# Patient Record
Sex: Female | Born: 1965
Health system: Southern US, Community
[De-identification: ages and names within clinical notes are randomized; demographics above are authoritative.]

## PROBLEM LIST (undated history)

## (undated) ENCOUNTER — Ambulatory Visit

## (undated) ENCOUNTER — Encounter: Attending: Family | Primary: Family

## (undated) ENCOUNTER — Encounter

## (undated) ENCOUNTER — Encounter: Attending: Physician Assistant | Primary: Physician Assistant

## (undated) ENCOUNTER — Ambulatory Visit: Payer: PRIVATE HEALTH INSURANCE

## (undated) ENCOUNTER — Encounter
Attending: Student in an Organized Health Care Education/Training Program | Primary: Student in an Organized Health Care Education/Training Program

## (undated) ENCOUNTER — Telehealth: Attending: Physician Assistant | Primary: Physician Assistant

## (undated) ENCOUNTER — Ambulatory Visit
Payer: PRIVATE HEALTH INSURANCE | Attending: Student in an Organized Health Care Education/Training Program | Primary: Student in an Organized Health Care Education/Training Program

## (undated) ENCOUNTER — Ambulatory Visit
Payer: BLUE CROSS/BLUE SHIELD | Attending: Student in an Organized Health Care Education/Training Program | Primary: Student in an Organized Health Care Education/Training Program

## (undated) ENCOUNTER — Encounter: Attending: Family Medicine | Primary: Family Medicine

## (undated) ENCOUNTER — Telehealth

## (undated) ENCOUNTER — Telehealth
Attending: Student in an Organized Health Care Education/Training Program | Primary: Student in an Organized Health Care Education/Training Program

## (undated) ENCOUNTER — Encounter: Payer: BLUE CROSS/BLUE SHIELD | Attending: Physician Assistant | Primary: Physician Assistant

## (undated) ENCOUNTER — Ambulatory Visit: Payer: BLUE CROSS/BLUE SHIELD

## (undated) ENCOUNTER — Encounter: Payer: BLUE CROSS/BLUE SHIELD | Attending: Family | Primary: Family

## (undated) ENCOUNTER — Encounter: Attending: Diagnostic Radiology | Primary: Diagnostic Radiology

---

## 1999-01-19 HISTORY — PX: TUBAL LIGATION: SHX77

## 2004-06-01 ENCOUNTER — Ambulatory Visit: Payer: Self-pay | Admitting: Obstetrics and Gynecology

## 2004-10-07 ENCOUNTER — Ambulatory Visit: Payer: Self-pay | Admitting: Obstetrics and Gynecology

## 2004-10-20 ENCOUNTER — Ambulatory Visit: Payer: Self-pay | Admitting: General Surgery

## 2005-04-12 ENCOUNTER — Ambulatory Visit: Payer: Self-pay | Admitting: General Surgery

## 2005-07-26 ENCOUNTER — Ambulatory Visit: Payer: Self-pay | Admitting: Physician Assistant

## 2006-01-20 ENCOUNTER — Ambulatory Visit: Payer: Self-pay | Admitting: Obstetrics and Gynecology

## 2009-04-23 ENCOUNTER — Ambulatory Visit: Payer: Self-pay | Admitting: Obstetrics and Gynecology

## 2009-04-25 ENCOUNTER — Ambulatory Visit: Payer: Self-pay | Admitting: Obstetrics and Gynecology

## 2010-12-26 ENCOUNTER — Ambulatory Visit: Payer: Self-pay | Admitting: Specialist

## 2011-01-19 HISTORY — PX: ABLATION: SHX5711

## 2011-10-18 ENCOUNTER — Ambulatory Visit: Payer: Self-pay | Admitting: Family Medicine

## 2011-11-22 ENCOUNTER — Ambulatory Visit: Payer: Self-pay | Admitting: Gastroenterology

## 2012-03-08 ENCOUNTER — Ambulatory Visit: Payer: Self-pay | Admitting: Obstetrics and Gynecology

## 2012-05-15 ENCOUNTER — Ambulatory Visit: Payer: Self-pay | Admitting: Gastroenterology

## 2012-09-30 ENCOUNTER — Emergency Department: Payer: Self-pay | Admitting: Emergency Medicine

## 2012-09-30 LAB — URINALYSIS, COMPLETE
Bilirubin,UR: NEGATIVE
Glucose,UR: NEGATIVE mg/dL (ref 0–75)
Ketone: NEGATIVE
Leukocyte Esterase: NEGATIVE
Ph: 5 (ref 4.5–8.0)
RBC,UR: 1 /HPF (ref 0–5)
Squamous Epithelial: 13
WBC UR: 1 /HPF (ref 0–5)

## 2012-09-30 LAB — COMPREHENSIVE METABOLIC PANEL
Albumin: 3.7 g/dL (ref 3.4–5.0)
Alkaline Phosphatase: 86 U/L (ref 50–136)
Anion Gap: 5 — ABNORMAL LOW (ref 7–16)
BUN: 12 mg/dL (ref 7–18)
Bilirubin,Total: 0.8 mg/dL (ref 0.2–1.0)
Calcium, Total: 8.8 mg/dL (ref 8.5–10.1)
Chloride: 107 mmol/L (ref 98–107)
Co2: 26 mmol/L (ref 21–32)
Creatinine: 0.91 mg/dL (ref 0.60–1.30)
Osmolality: 275 (ref 275–301)
Potassium: 3.9 mmol/L (ref 3.5–5.1)
SGOT(AST): 26 U/L (ref 15–37)
SGPT (ALT): 50 U/L (ref 12–78)
Sodium: 138 mmol/L (ref 136–145)
Total Protein: 7.1 g/dL (ref 6.4–8.2)

## 2012-09-30 LAB — CBC
HCT: 38.2 % (ref 35.0–47.0)
MCHC: 34.4 g/dL (ref 32.0–36.0)
RDW: 13.3 % (ref 11.5–14.5)

## 2012-09-30 LAB — TROPONIN I: Troponin-I: <0.02 ng/mL

## 2013-01-18 HISTORY — PX: BREAST CYST ASPIRATION: SHX578

## 2013-03-09 ENCOUNTER — Ambulatory Visit: Payer: Self-pay | Admitting: Obstetrics and Gynecology

## 2013-03-12 ENCOUNTER — Ambulatory Visit: Payer: Self-pay | Admitting: Obstetrics and Gynecology

## 2013-05-29 DIAGNOSIS — N949 Unspecified condition associated with female genital organs and menstrual cycle: Secondary | ICD-10-CM | POA: Insufficient documentation

## 2013-05-29 DIAGNOSIS — N83209 Unspecified ovarian cyst, unspecified side: Secondary | ICD-10-CM | POA: Insufficient documentation

## 2014-10-30 ENCOUNTER — Other Ambulatory Visit: Payer: Self-pay | Admitting: Obstetrics and Gynecology

## 2014-10-30 DIAGNOSIS — Z1231 Encounter for screening mammogram for malignant neoplasm of breast: Secondary | ICD-10-CM

## 2015-10-23 DIAGNOSIS — R002 Palpitations: Secondary | ICD-10-CM | POA: Insufficient documentation

## 2016-11-18 DIAGNOSIS — S96911A Strain of unspecified muscle and tendon at ankle and foot level, right foot, initial encounter: Secondary | ICD-10-CM | POA: Diagnosis not present

## 2016-11-18 DIAGNOSIS — M79671 Pain in right foot: Secondary | ICD-10-CM | POA: Diagnosis not present

## 2016-11-22 DIAGNOSIS — Z0000 Encounter for general adult medical examination without abnormal findings: Secondary | ICD-10-CM | POA: Diagnosis not present

## 2016-11-22 DIAGNOSIS — E559 Vitamin D deficiency, unspecified: Secondary | ICD-10-CM | POA: Diagnosis not present

## 2016-11-22 DIAGNOSIS — Z7189 Other specified counseling: Secondary | ICD-10-CM | POA: Insufficient documentation

## 2016-11-22 DIAGNOSIS — Z7185 Encounter for immunization safety counseling: Secondary | ICD-10-CM | POA: Insufficient documentation

## 2016-11-22 DIAGNOSIS — F411 Generalized anxiety disorder: Secondary | ICD-10-CM | POA: Insufficient documentation

## 2016-11-26 DIAGNOSIS — E559 Vitamin D deficiency, unspecified: Secondary | ICD-10-CM | POA: Diagnosis not present

## 2016-11-26 DIAGNOSIS — Z0000 Encounter for general adult medical examination without abnormal findings: Secondary | ICD-10-CM | POA: Diagnosis not present

## 2016-11-29 DIAGNOSIS — E559 Vitamin D deficiency, unspecified: Secondary | ICD-10-CM | POA: Insufficient documentation

## 2016-11-29 DIAGNOSIS — R7303 Prediabetes: Secondary | ICD-10-CM | POA: Insufficient documentation

## 2016-11-30 DIAGNOSIS — N390 Urinary tract infection, site not specified: Secondary | ICD-10-CM | POA: Diagnosis not present

## 2016-11-30 DIAGNOSIS — R399 Unspecified symptoms and signs involving the genitourinary system: Secondary | ICD-10-CM | POA: Diagnosis not present

## 2017-01-04 DIAGNOSIS — E7800 Pure hypercholesterolemia, unspecified: Secondary | ICD-10-CM | POA: Diagnosis not present

## 2017-01-04 DIAGNOSIS — M5440 Lumbago with sciatica, unspecified side: Secondary | ICD-10-CM | POA: Diagnosis not present

## 2017-01-04 DIAGNOSIS — R350 Frequency of micturition: Secondary | ICD-10-CM | POA: Diagnosis not present

## 2017-02-01 DIAGNOSIS — M21621 Bunionette of right foot: Secondary | ICD-10-CM | POA: Diagnosis not present

## 2017-03-01 DIAGNOSIS — R7303 Prediabetes: Secondary | ICD-10-CM | POA: Diagnosis not present

## 2017-03-01 DIAGNOSIS — E7800 Pure hypercholesterolemia, unspecified: Secondary | ICD-10-CM | POA: Diagnosis not present

## 2017-03-03 DIAGNOSIS — R350 Frequency of micturition: Secondary | ICD-10-CM | POA: Diagnosis not present

## 2017-03-03 DIAGNOSIS — R7303 Prediabetes: Secondary | ICD-10-CM | POA: Diagnosis not present

## 2017-03-16 ENCOUNTER — Ambulatory Visit: Payer: 59 | Admitting: Urology

## 2017-03-16 ENCOUNTER — Encounter: Payer: Self-pay | Admitting: Urology

## 2017-03-16 VITALS — BP 148/83 | HR 87 | Resp 16 | Ht 64.0 in | Wt 178.0 lb

## 2017-03-16 DIAGNOSIS — R358 Other polyuria: Secondary | ICD-10-CM | POA: Diagnosis not present

## 2017-03-16 DIAGNOSIS — R3589 Other polyuria: Secondary | ICD-10-CM

## 2017-03-16 LAB — BLADDER SCAN AMB NON-IMAGING: Scan Result: 46

## 2017-03-16 NOTE — Progress Notes (Signed)
03/16/2017 10:54 AM   Rachel [name] 11-03-1965 [id number]  Referring provider: Bobby Rumpfarew, Khary, MD [address] [place], [place] [id number]  Chief Complaint  Patient presents with  . New Patient (Initial Visit)    bladder issue    HPI: Patient is a 52 -year-old [place] American female who is referred to us by, Dr. Burnadette PopLinthavong, for urinary frequency.    Patient states that she has had urinary frequency and suprapubic pressure since November 2018.  She was seen by urgent care on three occassions and was found to have negative UA's and negative urine cultures.  She has baseline nocturia x 2.  Her PVR is 46 mL.    She is not having associated urinary urgency, dysuria, intermittency, hesitancy, straining to urinate and weak urinary stream.   She does/does not have a history of urinary tract infections, STI's or injury to the bladder.     She currently denies dysuria, gross hematuria, suprapubic pain, back pain, abdominal pain or flank pain.  She has not had any recent fevers, chills, nausea or vomiting.   She does not have a history of nephrolithiasis, GU surgery or GU trauma.   She denies constipation and/or diarrhea.   She has not had any recent imaging studies.    In December, she decided to eliminate all colas from her diet.  She is now only drinking water and her symptoms have abated entirely.        PMH: History reviewed. No pertinent past medical history.  Surgical History: Past Surgical History:  Procedure Laterality Date  . ABLATION  2013  . TUBAL LIGATION  2001    Home Medications:  Allergies as of 03/16/2017      Reactions   Sulfa Antibiotics Diarrhea   Dexlansoprazole Diarrhea, Nausea Only      Medication List        Accurate as of 03/16/17 10:54 AM. Always use your most recent med list.          metoprolol tartrate 25 MG tablet Commonly known as:  LOPRESSOR Take by mouth.   PARoxetine 20 MG tablet Commonly known as:  PAXIL Take by mouth.       Allergies:  Allergies  Allergen Reactions  . Sulfa Antibiotics Diarrhea  . Dexlansoprazole Diarrhea and Nausea Only    Family History: History reviewed. No pertinent family history.  Social History:  reports that  has never smoked. she has never used smokeless tobacco. She reports that she does not drink alcohol or use drugs.  ROS: UROLOGY Frequent Urination?: Yes Hard to postpone urination?: No Burning/pain with urination?: No Get up at night to urinate?: Yes Leakage of urine?: No Urine stream starts and stops?: No Trouble starting stream?: No Do you have to strain to urinate?: No Blood in urine?: No Urinary tract infection?: No Sexually transmitted disease?: No Injury to kidneys or bladder?: No Painful intercourse?: No Weak stream?: No Currently pregnant?: No Vaginal bleeding?: No Last menstrual period?: n  Gastrointestinal Nausea?: No Vomiting?: No Indigestion/heartburn?: No Diarrhea?: No Constipation?: No  Constitutional Fever: No Night sweats?: Yes Weight loss?: No Fatigue?: No  Skin Skin rash/lesions?: No Itching?: No  Eyes Blurred vision?: No Double vision?: No  Ears/Nose/Throat Sore throat?: No Sinus problems?: No  Hematologic/Lymphatic Swollen glands?: No Easy bruising?: No  Cardiovascular Leg swelling?: No Chest pain?: No  Respiratory Cough?: No Shortness of breath?: No  Endocrine Excessive thirst?: No  Musculoskeletal Back pain?: No Joint pain?: No  Neurological Headaches?: No Dizziness?:  No  Psychologic Depression?: No Anxiety?: No  Physical Exam: BP (!) 148/83   Pulse 87   Resp 16   Ht 5\' 4"  (1.626 m)   Wt 178 lb (80.7 kg)   SpO2 98%   BMI 30.55 kg/m   Constitutional: Well nourished. Alert and oriented, No acute distress. HEENT: [place] AT, moist mucus membranes. Trachea midline, no masses. Cardiovascular: No clubbing, cyanosis, or edema. Respiratory: Normal respiratory effort, no increased work of  breathing. GI: Abdomen is soft, non tender, non distended, no abdominal masses. Liver and spleen not palpable.  No hernias appreciated.  Stool sample for occult testing is not indicated.   GU: No CVA tenderness.  No bladder fullness or masses.   Skin: No rashes, bruises or suspicious lesions. Lymph: No cervical or inguinal adenopathy. Neurologic: Grossly intact, no focal deficits, moving all 4 extremities. Psychiatric: Normal mood and affect.  Laboratory Data: Lab Results  Component Value Date   WBC 5.3 09/30/2012   HGB 13.2 09/30/2012   HCT 38.2 09/30/2012   MCV 85 09/30/2012   PLT 268 09/30/2012    Lab Results  Component Value Date   CREATININE 0.91 09/30/2012    No results found for: PSA  No results found for: TESTOSTERONE  No results found for: HGBA1C  No results found for: TSH  No results found for: CHOL, HDL, CHOLHDL, VLDL, LDLCALC  Lab Results  Component Value Date   AST 26 09/30/2012   Lab Results  Component Value Date   ALT 50 09/30/2012   No components found for: ALKALINEPHOPHATASE No components found for: BILIRUBINTOTAL  No results found for: ESTRADIOL  Urinalysis    Component Value Date/Time   COLORURINE Yellow 09/30/2012 0736   APPEARANCEUR Hazy 09/30/2012 0736   LABSPEC 1.025 09/30/2012 0736   PHURINE 5.0 09/30/2012 0736   GLUCOSEU Negative 09/30/2012 0736   HGBUR Negative 09/30/2012 0736   BILIRUBINUR Negative 09/30/2012 0736   KETONESUR Negative 09/30/2012 0736   PROTEINUR Negative 09/30/2012 0736   NITRITE Negative 09/30/2012 0736   LEUKOCYTESUR Negative 09/30/2012 0736    I have reviewed the labs.   Pertinent Imaging: Results for [name], [name] (MRN [id number]) as of 03/18/2017 18:08  Ref. Range 03/16/2017 10:15  Scan Result Unknown 46    Assessment & Plan:    1. Frequency -resolved -Discussed with the patient that she may possibly have interstitial cystitis as she has a typical presentation of urinary tract infection  symptoms with negative cultures but that this was a diagnosis of exclusion and that further testing would be involved if we wanted to investigate her condition further -She stated at this time since her symptoms have abated with the elimination of sodas that she would like to hold on any further testing unless her symptoms return -I have given her the bladder irritant food list - Bladder Scan (Post Void Residual) in office -She will return if her symptoms return or if she should develop gross hematuria or AMH is found by PCP on UA's    Return if symptoms worsen or fail to improve.  These notes generated with voice recognition software. I apologize for typographical errors.  Michiel Cowboy, PA-C  [facility] [address], [address] [place], [place] [address] [phone]   I spent 40 minutes with this patient regarding her urinary condition greater than 50% was spent in counseling & coordination of care with the patient.

## 2017-03-17 DIAGNOSIS — M79671 Pain in right foot: Secondary | ICD-10-CM | POA: Diagnosis not present

## 2017-03-17 DIAGNOSIS — M7671 Peroneal tendinitis, right leg: Secondary | ICD-10-CM | POA: Diagnosis not present

## 2017-04-14 DIAGNOSIS — M7671 Peroneal tendinitis, right leg: Secondary | ICD-10-CM | POA: Diagnosis not present

## 2017-05-06 ENCOUNTER — Other Ambulatory Visit: Payer: Self-pay

## 2017-05-06 ENCOUNTER — Emergency Department
Admission: EM | Admit: 2017-05-06 | Discharge: 2017-05-06 | Disposition: A | Discharge status: Home / Self Care | Payer: 59 | Attending: Student in an Organized Health Care Education/Training Program | Admitting: Student in an Organized Health Care Education/Training Program

## 2017-05-06 ENCOUNTER — Encounter: Payer: Self-pay | Admitting: Emergency Medicine

## 2017-05-06 DIAGNOSIS — S81851A Open bite, right lower leg, initial encounter: Secondary | ICD-10-CM | POA: Insufficient documentation

## 2017-05-06 DIAGNOSIS — Z23 Encounter for immunization: Secondary | ICD-10-CM | POA: Insufficient documentation

## 2017-05-06 DIAGNOSIS — Y9389 Activity, other specified: Secondary | ICD-10-CM | POA: Diagnosis not present

## 2017-05-06 DIAGNOSIS — R2241 Localized swelling, mass and lump, right lower limb: Secondary | ICD-10-CM | POA: Insufficient documentation

## 2017-05-06 DIAGNOSIS — Z79899 Other long term (current) drug therapy: Secondary | ICD-10-CM | POA: Diagnosis not present

## 2017-05-06 DIAGNOSIS — W540XXA Bitten by dog, initial encounter: Secondary | ICD-10-CM | POA: Diagnosis not present

## 2017-05-06 DIAGNOSIS — Y92019 Unspecified place in single-family (private) house as the place of occurrence of the external cause: Secondary | ICD-10-CM | POA: Diagnosis not present

## 2017-05-06 DIAGNOSIS — Y998 Other external cause status: Secondary | ICD-10-CM | POA: Diagnosis not present

## 2017-05-06 MED ORDER — AMOXICILLIN-POT CLAVULANATE 875-125 MG PO TABS: 1.0000 | ORAL_TABLET | Freq: Two times a day (BID) | ORAL | 0 refills | Status: AC

## 2017-05-06 MED ORDER — TETANUS-DIPHTH-ACELL PERTUSSIS 5-2.5-18.5 LF-MCG/0.5 IM SUSP
0.5000 mL | Freq: Once | INTRAMUSCULAR | Status: AC
  Administered 2017-05-06: 0.5 mL via INTRAMUSCULAR
  Filled 2017-05-06: qty 0.5

## 2017-05-06 MED ORDER — AMOXICILLIN-POT CLAVULANATE 875-125 MG PO TABS: 1.0000 | ORAL_TABLET | Freq: Once | ORAL | Status: DC

## 2017-05-06 NOTE — ED Notes (Signed)
Pt states she was bit by a friend's dog in the last hour on her lower right leg. Pt reports stinging pain. States per friend, dog's vaccines are "current within the last 3-5 years." Pt unsure further. Pt in NAD at this time. Ambulatory from triage.

## 2017-05-06 NOTE — ED Triage Notes (Signed)
Dog bite, to right shin. Bitten by sister's dog today.  Incident took place [address] sisters [address] [place], [place].

## 2017-05-06 NOTE — Discharge Instructions (Addendum)
Please apply ice to the right leg 20 minutes every hour as needed for any pain or swelling.  You may alternate Tylenol and/or ibuprofen as needed for pain.  Please take antibiotics as prescribed to prevent any infection to the right lower leg.  If any increasing pain, swelling, redness return to the ER.

## 2017-05-06 NOTE — ED Provider Notes (Signed)
[facility] EMERGENCY DEPARTMENT Provider Note   CSN: [id number] Arrival date & time: 05/06/17  1644     History   Chief Complaint Chief Complaint  Patient presents with  . Animal Bite    HPI CHI WOODHAM is a 52 y.o. female presents to the emergency department for evaluation of animal bite to the right leg.  Injury occurred just prior to arrival.  Patient's tetanus and dog's rabies vaccinations are not noted to be up-to-date.  Patient states she was at a family member's house when she was bit by small Maltese in the right anterior shin.  There was no bleeding.  Patient started to develop mild swelling with mild redness and decided come to the emergency department.  Patient's pain is 0 out of 10.  She is ambulatory no assistive devices.  Animal control has been notified.    HPI  History reviewed. No pertinent past medical history.  There are no active problems to display for this patient.   Past Surgical History:  Procedure Laterality Date  . ABLATION  2013  . TUBAL LIGATION  2001     OB History   None      Home Medications    Prior to Admission medications   Medication Sig Start Date End Date Taking? Authorizing Provider  amoxicillin-clavulanate (AUGMENTIN) 875-125 MG tablet Take 1 tablet by mouth every 12 (twelve) hours for 5 days. 05/06/17 05/11/17  Evon Slack, PA-C  metoprolol tartrate (LOPRESSOR) 25 MG tablet Take by mouth.    [provider]  PARoxetine (PAXIL) 20 MG tablet Take by mouth. 01/04/17 01/04/18  [provider]    Family History No family history on file.  Social History Social History   Tobacco Use  . Smoking status: Never Smoker  . Smokeless tobacco: Never Used  Substance Use Topics  . Alcohol use: No    Frequency: Never  . Drug use: No     Allergies   Sulfa antibiotics and Dexlansoprazole   Review of Systems Review of Systems  Constitutional: Negative for activity change and  fever.  Musculoskeletal: Negative for arthralgias, gait problem, joint swelling and myalgias.  Skin: Positive for rash. Negative for wound.  Neurological: Negative for weakness.     Physical Exam Updated Vital Signs BP (!) 146/59 (BP Location: Left Arm)   Pulse 62   Temp 98.2 F (36.8 C) (Oral)   Resp 16   Ht 5\' 4"  (1.626 m)   Wt 78 kg (172 lb)   SpO2 96%   BMI 29.52 kg/m   Physical Exam  Constitutional: She is oriented to person, place, and time. She appears well-developed and well-nourished.  HENT:  Head: Normocephalic and atraumatic.  Neck: Normal range of motion.  Cardiovascular: Normal rate.  Pulmonary/Chest: Effort normal. No respiratory distress.  Musculoskeletal: Normal range of motion.  Examination of the right lower extremity shows small abrasion to the anterior shin with minimal soft tissue swelling present.  There is soft tissue swelling and abrasion is approximately 2 cm in diameter.  There is no active bleeding, skin breakdown is very minimal.  She is nontender palpation.  Neurological: She is alert and oriented to person, place, and time.  Skin: Skin is warm. No rash noted.  Psychiatric: She has a normal mood and affect. Her behavior is normal. Thought content normal.     ED Treatments / Results  Labs (all labs ordered are listed, but only abnormal results are displayed) Labs Reviewed - No data  to display  EKG None  Radiology No results found.  Procedures Procedures (including critical care time)  Medications Ordered in ED Medications  Tdap (BOOSTRIX) injection 0.5 mL (has no administration in time range)     Initial Impression / Assessment and Plan / ED Course  I have reviewed the triage vital signs and the nursing notes.  Pertinent labs & imaging results that were available during my care of the patient were reviewed by me and considered in my medical decision making (see chart for details).     52 year old female with dog bite to the  right lower leg.  And will control notified.  Patient's tetanus is updated.  She is started on 5-day course of Augmentin for prophylaxis.  Animal control notified and will follow up to make sure dogs vaccinations are either up-to-date Toradol can be quarantined.  Final Clinical Impressions(s) / ED Diagnoses   Final diagnoses:  Dog bite, initial encounter  Dog bite of right lower leg, initial encounter    ED Discharge Orders        Ordered    amoxicillin-clavulanate (AUGMENTIN) 875-125 MG tablet  Every 12 hours     05/06/17 1707       Ronnette JuniperGaines, Lyall Faciane C, PA-C 05/06/17 1717    Willy Eddyobinson, Patrick, MD 05/06/17 [phone]

## 2017-05-09 DIAGNOSIS — E7800 Pure hypercholesterolemia, unspecified: Secondary | ICD-10-CM | POA: Diagnosis not present

## 2017-05-09 DIAGNOSIS — E559 Vitamin D deficiency, unspecified: Secondary | ICD-10-CM | POA: Diagnosis not present

## 2017-05-09 DIAGNOSIS — R7303 Prediabetes: Secondary | ICD-10-CM | POA: Diagnosis not present

## 2017-05-11 DIAGNOSIS — R7303 Prediabetes: Secondary | ICD-10-CM | POA: Diagnosis not present

## 2017-05-11 DIAGNOSIS — R740 Nonspecific elevation of levels of transaminase and lactic acid dehydrogenase [LDH]: Secondary | ICD-10-CM | POA: Diagnosis not present

## 2017-05-11 DIAGNOSIS — E559 Vitamin D deficiency, unspecified: Secondary | ICD-10-CM | POA: Diagnosis not present

## 2017-05-18 DIAGNOSIS — R945 Abnormal results of liver function studies: Secondary | ICD-10-CM | POA: Diagnosis not present

## 2017-06-07 DIAGNOSIS — R079 Chest pain, unspecified: Secondary | ICD-10-CM | POA: Diagnosis not present

## 2017-06-07 DIAGNOSIS — R0789 Other chest pain: Secondary | ICD-10-CM | POA: Diagnosis not present

## 2017-06-07 DIAGNOSIS — R002 Palpitations: Secondary | ICD-10-CM | POA: Diagnosis not present

## 2017-06-16 DIAGNOSIS — R079 Chest pain, unspecified: Secondary | ICD-10-CM | POA: Diagnosis not present

## 2017-06-20 DIAGNOSIS — R002 Palpitations: Secondary | ICD-10-CM | POA: Diagnosis not present

## 2017-06-20 DIAGNOSIS — R079 Chest pain, unspecified: Secondary | ICD-10-CM | POA: Diagnosis not present

## 2017-08-23 DIAGNOSIS — M79671 Pain in right foot: Secondary | ICD-10-CM | POA: Diagnosis not present

## 2017-08-23 DIAGNOSIS — M7741 Metatarsalgia, right foot: Secondary | ICD-10-CM | POA: Diagnosis not present

## 2017-09-14 DIAGNOSIS — M7741 Metatarsalgia, right foot: Secondary | ICD-10-CM | POA: Diagnosis not present

## 2017-09-20 DIAGNOSIS — E559 Vitamin D deficiency, unspecified: Secondary | ICD-10-CM | POA: Diagnosis not present

## 2017-09-20 DIAGNOSIS — R740 Nonspecific elevation of levels of transaminase and lactic acid dehydrogenase [LDH]: Secondary | ICD-10-CM | POA: Diagnosis not present

## 2017-09-20 DIAGNOSIS — R7303 Prediabetes: Secondary | ICD-10-CM | POA: Diagnosis not present

## 2017-09-23 ENCOUNTER — Ambulatory Visit (INDEPENDENT_AMBULATORY_CARE_PROVIDER_SITE_OTHER): Payer: 59

## 2017-09-23 ENCOUNTER — Encounter: Payer: Self-pay | Admitting: Podiatry

## 2017-09-23 ENCOUNTER — Ambulatory Visit: Payer: 59 | Admitting: Podiatry

## 2017-09-23 VITALS — BP 122/77 | HR 81 | Resp 16

## 2017-09-23 DIAGNOSIS — M778 Other enthesopathies, not elsewhere classified: Secondary | ICD-10-CM

## 2017-09-23 DIAGNOSIS — M779 Enthesopathy, unspecified: Secondary | ICD-10-CM | POA: Diagnosis not present

## 2017-09-23 DIAGNOSIS — M7751 Other enthesopathy of right foot: Secondary | ICD-10-CM

## 2017-09-23 MED ORDER — MELOXICAM 15 MG PO TABS: 15.0000 mg | ORAL_TABLET | Freq: Every day | ORAL | 1 refills | Status: AC

## 2017-09-26 NOTE — Progress Notes (Signed)
   HPI: 52 year old female presenting today as a new patient with a chief complaint of aching pain to the right foot that began 4-5 months ago. She reports associated redness of the foot. Walking increases the pain. She has been wearing a CAM boot, has taken Prednisone and Celebrex, had a cortisone injections, all with no significant relief. She also states she had labs drawn that were all negative. Patient is here for further evaluation and treatment.   No past medical history on file.   Physical Exam: General: The patient is alert and oriented x3 in no acute distress.  Dermatology: Skin is warm, dry and supple bilateral lower extremities. Negative for open lesions or macerations.  Vascular: Palpable pedal pulses bilaterally. No edema or erythema noted. Capillary refill within normal limits.  Neurological: Epicritic and protective threshold grossly intact bilaterally.   Musculoskeletal Exam: Pain with palpation of the 5th MPJ of the right foot as well as to the the metatarsals of the right forefoot. Range of motion within normal limits to all pedal and ankle joints bilateral. Muscle strength 5/5 in all groups bilateral.   Radiographic Exam:  Normal osseous mineralization. Joint spaces preserved. No fracture/dislocation/boney destruction.    Assessment: 1. 5th MPJ capsulitis right 2. Metatarsalgia right forefoot   Plan of Care:  1. Patient evaluated. X-Rays reviewed.  2. Injection of 0.5 mLs Celestone Soluspan injected into the 5th MPJ of the right foot.  3. Prescription for Meloxicam provided to patient.  4. Return to clinic in 4 weeks. If not better, we will order an MRI.       Felecia Shelling, DPM [facility]  Dr. Felecia Shelling, DPM    [address] [place], [place] [address]                Office [phone]  Fax [phone]

## 2017-09-27 DIAGNOSIS — E559 Vitamin D deficiency, unspecified: Secondary | ICD-10-CM | POA: Diagnosis not present

## 2017-09-27 DIAGNOSIS — R7303 Prediabetes: Secondary | ICD-10-CM | POA: Diagnosis not present

## 2017-10-14 ENCOUNTER — Other Ambulatory Visit: Payer: Self-pay

## 2017-10-14 DIAGNOSIS — M7751 Other enthesopathy of right foot: Secondary | ICD-10-CM

## 2017-10-21 ENCOUNTER — Encounter: Payer: Self-pay | Admitting: Podiatry

## 2017-10-21 ENCOUNTER — Ambulatory Visit: Payer: 59 | Admitting: Podiatry

## 2017-10-21 DIAGNOSIS — M84374A Stress fracture, right foot, initial encounter for fracture: Secondary | ICD-10-CM | POA: Diagnosis not present

## 2017-10-21 DIAGNOSIS — M7751 Other enthesopathy of right foot: Secondary | ICD-10-CM | POA: Diagnosis not present

## 2017-10-21 NOTE — Progress Notes (Signed)
   HPI: 52 year old female presenting today for follow-up evaluation regarding right forefoot pain.  Patient is been treated for the past 6 months with multiple conservative modalities including immobilization in a Cam boot, oral anti-inflammatory medication (both steroidal and nonsteroidal), cortisone injections, and labs drawn and there is been no satisfactory alleviation of symptoms to the patient.  Patient has been treated by Dr. Ether Griffins, [facility] clinic, prior to presenting to me on 09/23/2017 for second opinion and evaluation.  Today the patient continues to have pain to the right forefoot.  Last visit on 09/23/2017 I injected the fifth MTPJ of the right forefoot and provided meloxicam.  No relief.  She presents for further treatment and evaluation   No past medical history on file.   Physical Exam: General: The patient is alert and oriented x3 in no acute distress.  Dermatology: Skin is warm, dry and supple bilateral lower extremities. Negative for open lesions or macerations.  Vascular: Palpable pedal pulses bilaterally. No edema or erythema noted. Capillary refill within normal limits.  Neurological: Epicritic and protective threshold grossly intact bilaterally.   Musculoskeletal Exam: Pain with palpation of the 5th MPJ of the right foot as well as to the the metatarsals of the right forefoot.  Pain appears to be most significant overlying the fifth metatarsal diaphysis likely consistent with stress fracture.  There is also an audible clicking and popping that is also palpable with range of motion to the fifth MTPJ right foot.  Range of motion within normal limits to all pedal and ankle joints bilateral. Muscle strength 5/5 in all groups bilateral.   Assessment: 1. 5th MPJ capsulitis right 2. Metatarsalgia right forefoot 3.  Possible loose body fifth MTPJ right foot 4.  Likely stress fracture fifth metatarsal right foot   Plan of Care:  1. Patient evaluated.  2.  Today we are going to  order an MRI right forefoot.  Patient is failed multiple conservative modalities.   3.  Return to clinic in 3 weeks to review MRI results     Felecia Shelling, DPM [facility]  Dr. Felecia Shelling, DPM    [address] [place], [place] [address]                Office [phone]  Fax [phone]

## 2017-10-24 ENCOUNTER — Telehealth: Payer: Self-pay

## 2017-10-24 NOTE — Telephone Encounter (Signed)
-----   Message from Felecia Shelling, DPM sent at 10/21/2017  8:26 AM EDT ----- Regarding: MRI RT foot Please order MRI RT foot.   Dx: Stress fracture Rt 5th metatarsal.   Thanks, Dr. Logan Bores

## 2017-10-24 NOTE — Telephone Encounter (Signed)
MRI has been approved from 10/24/17 to 12/08/17   Auth# [id number] Patient has been informed via voice mail to call and schedule appt to her convenience

## 2017-11-03 ENCOUNTER — Ambulatory Visit
Admission: RE | Admit: 2017-11-03 | Discharge: 2017-11-03 | Disposition: A | Discharge status: Home / Self Care | Payer: 59 | Source: Ambulatory Visit | Attending: Podiatry | Admitting: Podiatry

## 2017-11-03 DIAGNOSIS — S99921A Unspecified injury of right foot, initial encounter: Secondary | ICD-10-CM | POA: Diagnosis not present

## 2017-11-03 DIAGNOSIS — M7751 Other enthesopathy of right foot: Secondary | ICD-10-CM | POA: Diagnosis not present

## 2017-11-03 DIAGNOSIS — M79671 Pain in right foot: Secondary | ICD-10-CM | POA: Diagnosis not present

## 2017-11-09 ENCOUNTER — Telehealth: Payer: Self-pay | Admitting: *Deleted

## 2017-11-09 ENCOUNTER — Ambulatory Visit: Payer: 59 | Admitting: Podiatry

## 2017-11-09 DIAGNOSIS — M7751 Other enthesopathy of right foot: Secondary | ICD-10-CM

## 2017-11-09 DIAGNOSIS — M7671 Peroneal tendinitis, right leg: Secondary | ICD-10-CM | POA: Diagnosis not present

## 2017-11-09 NOTE — Telephone Encounter (Signed)
Emailed order to [email or web address].

## 2017-11-09 NOTE — Telephone Encounter (Signed)
-----   Message from Felecia Shelling, DPM sent at 11/09/2017  9:05 AM EDT ----- Regarding: Physical Therapy Please order physical therapy 3x/week x 4 weeks at [facility] in [place].  Thanks, Dr. Logan Bores

## 2017-11-09 NOTE — Telephone Encounter (Signed)
Required form, and demographics faxed to [facility].

## 2017-11-10 ENCOUNTER — Other Ambulatory Visit: Payer: Self-pay | Admitting: Obstetrics and Gynecology

## 2017-11-10 DIAGNOSIS — Z1239 Encounter for other screening for malignant neoplasm of breast: Secondary | ICD-10-CM | POA: Diagnosis not present

## 2017-11-10 DIAGNOSIS — Z124 Encounter for screening for malignant neoplasm of cervix: Secondary | ICD-10-CM | POA: Diagnosis not present

## 2017-11-10 DIAGNOSIS — Z1231 Encounter for screening mammogram for malignant neoplasm of breast: Secondary | ICD-10-CM

## 2017-11-10 DIAGNOSIS — Z1211 Encounter for screening for malignant neoplasm of colon: Secondary | ICD-10-CM | POA: Diagnosis not present

## 2017-11-10 DIAGNOSIS — Z01419 Encounter for gynecological examination (general) (routine) without abnormal findings: Secondary | ICD-10-CM | POA: Diagnosis not present

## 2017-11-13 NOTE — Progress Notes (Signed)
   HPI: 52 year old female presenting today for follow up evaluation of right foot pain. She states the pain is constant and unchanged since her last visit. She notes the pain is greatest in the mornings when she first gets up. She denies modifying factors. Patient is here for further evaluation and treatment.   No past medical history on file.   Physical Exam: General: The patient is alert and oriented x3 in no acute distress.  Dermatology: Skin is warm, dry and supple bilateral lower extremities. Negative for open lesions or macerations.  Vascular: Palpable pedal pulses bilaterally. No edema or erythema noted. Capillary refill within normal limits.  Neurological: Epicritic and protective threshold grossly intact bilaterally.   Musculoskeletal Exam: Pain with palpation to the insertion of the peroneal tendon of the right foot as well as to the 5th MPJ of the right foot. Range of motion within normal limits to all pedal and ankle joints bilateral. Muscle strength 5/5 in all groups bilateral.   MRI Impression: No evidence of trauma or finding to explain the patient's symptoms. Tiny subchondral cyst in the middle cuneiform at its articulation with the navicular consistent with very mild degenerative disease.  Assessment: 1. 5th MPJ capsulitis right  2. Insertional peroneal tendinitis right    Plan of Care:  1. Patient evaluated.  2. Injection of 0.5 mLs Celestone Soluspan injected into the insertion of the peroneal tendon sheath of the right foot.  3. Orders for physical therapy three times weekly for four weeks placed.  4. Discussed possible EPAT as a future option.  5. Return to clinic in 4 weeks.       Felecia Shelling, DPM [facility]  Dr. Felecia Shelling, DPM    [address] [place], [place] [address]                Office [phone]  Fax [phone]

## 2017-11-15 ENCOUNTER — Ambulatory Visit: Payer: 59 | Admitting: Podiatry

## 2017-11-15 DIAGNOSIS — M25571 Pain in right ankle and joints of right foot: Secondary | ICD-10-CM | POA: Diagnosis not present

## 2017-11-18 DIAGNOSIS — M25571 Pain in right ankle and joints of right foot: Secondary | ICD-10-CM | POA: Diagnosis not present

## 2017-11-21 DIAGNOSIS — M25571 Pain in right ankle and joints of right foot: Secondary | ICD-10-CM | POA: Diagnosis not present

## 2017-11-25 DIAGNOSIS — M25571 Pain in right ankle and joints of right foot: Secondary | ICD-10-CM | POA: Diagnosis not present

## 2017-11-30 DIAGNOSIS — M25571 Pain in right ankle and joints of right foot: Secondary | ICD-10-CM | POA: Diagnosis not present

## 2017-12-05 DIAGNOSIS — M25571 Pain in right ankle and joints of right foot: Secondary | ICD-10-CM | POA: Diagnosis not present

## 2017-12-06 ENCOUNTER — Ambulatory Visit
Admission: RE | Admit: 2017-12-06 | Discharge: 2017-12-06 | Disposition: A | Discharge status: Home / Self Care | Payer: 59 | Source: Ambulatory Visit | Attending: Obstetrics and Gynecology | Admitting: Obstetrics and Gynecology

## 2017-12-06 ENCOUNTER — Ambulatory Visit: Payer: 59 | Admitting: Podiatry

## 2017-12-06 ENCOUNTER — Encounter: Payer: Self-pay | Admitting: Podiatry

## 2017-12-06 DIAGNOSIS — M7751 Other enthesopathy of right foot: Secondary | ICD-10-CM | POA: Diagnosis not present

## 2017-12-06 DIAGNOSIS — Z1231 Encounter for screening mammogram for malignant neoplasm of breast: Secondary | ICD-10-CM | POA: Diagnosis not present

## 2017-12-06 NOTE — Patient Instructions (Signed)
Pre-Operative Instructions  Congratulations, you have decided to take an important step towards improving your quality of life.  You can be assured that the doctors and staff at [facility] will be with you every step of the way.  Here are some important things you should know:  1. Plan to be at the surgery center/hospital at least 1 (one) hour prior to your scheduled time, unless otherwise directed by the surgical center/hospital staff.  You must have a responsible adult accompany you, remain during the surgery and drive you home.  Make sure you have directions to the surgical center/hospital to ensure you arrive on time. 2. If you are having surgery at [facility] or [facility] hospitals, you will need a copy of your medical history and physical form from your family physician within one month prior to the date of surgery. We will give you a form for your primary physician to complete.  3. We make every effort to accommodate the date you request for surgery.  However, there are times where surgery dates or times have to be moved.  We will contact you as soon as possible if a change in schedule is required.   4. No aspirin/ibuprofen for one week before surgery.  If you are on aspirin, any non-steroidal anti-inflammatory medications (Mobic, Aleve, Ibuprofen) should not be taken seven (7) days prior to your surgery.  You make take Tylenol for pain prior to surgery.  5. Medications - If you are taking daily heart and blood pressure medications, seizure, reflux, allergy, asthma, anxiety, pain or diabetes medications, make sure you notify the surgery center/hospital before the day of surgery so they can tell you which medications you should take or avoid the day of surgery. 6. No food or drink after midnight the night before surgery unless directed otherwise by surgical center/hospital staff. 7. No alcoholic beverages 24-hours prior to surgery.  No smoking 24-hours prior or 24-hours after  surgery. 8. Wear loose pants or shorts. They should be loose enough to fit over bandages, boots, and casts. 9. Don't wear slip-on shoes. Sneakers are preferred. 10. Bring your boot with you to the surgery center/hospital.  Also bring crutches or a walker if your physician has prescribed it for you.  If you do not have this equipment, it will be provided for you after surgery. 11. If you have not been contacted by the surgery center/hospital by the day before your surgery, call to confirm the date and time of your surgery. 12. Leave-time from work may vary depending on the type of surgery you have.  Appropriate arrangements should be made prior to surgery with your employer. 13. Prescriptions will be provided immediately following surgery by your doctor.  Fill these as soon as possible after surgery and take the medication as directed. Pain medications will not be refilled on weekends and must be approved by the doctor. 14. Remove nail polish on the operative foot and avoid getting pedicures prior to surgery. 15. Wash the night before surgery.  The night before surgery wash the foot and leg well with water and the antibacterial soap provided. Be sure to pay special attention to beneath the toenails and in between the toes.  Wash for at least three (3) minutes. Rinse thoroughly with water and dry well with a towel.  Perform this wash unless told not to do so by your physician.  Enclosed: 1 Ice pack (please put in freezer the night before surgery)   1 Hibiclens skin cleaner     Pre-op instructions  If you have any questions regarding the instructions, please do not hesitate to call our office.  [place]: [address], [place], [place] [address] -- [phone]  [place]: [address] [place], [place] [address] -- [phone]  [place]: [address]  [place], [place] [address] -- [phone]  [place]: [address], [address], [place], [place] [address] -- [phone]  Website:  [email or web address] 

## 2017-12-07 ENCOUNTER — Telehealth: Payer: Self-pay | Admitting: *Deleted

## 2017-12-07 NOTE — Telephone Encounter (Signed)
"  Dr. Amalia Hailey told me to give you a call to schedule my surgery.  He said for me to tell you to get me schedule before the end of the year on a Friday because I have met my deductible."  He can do it on December 20.  "He doesn't have anything else?"  He does not.  "Okay, put me down for then."  I'll get it scheduled.  Someone from the surgical center will give you a call a day or two prior to your surgical date.  They will give you a call a day or two prior to your surgical date and they will give you your arrival time.  You need to register online with the surgical center, instructions are in the brochure we gave you.  "Do you schedule my follow up appointments or does someone else?"  Someone else schedules those appointments.  "Okay, thank you."

## 2017-12-08 NOTE — Progress Notes (Signed)
   HPI: 52 year old female presenting today for follow up evaluation of right foot pain. She states her symptoms have improved but have not resolved completely. She reports continued pain to the 5th MPJ. There are no modifying factors noted. Patient is here for further evaluation and treatment.   History reviewed. No pertinent past medical history.   Physical Exam: General: The patient is alert and oriented x3 in no acute distress.  Dermatology: Skin is warm, dry and supple bilateral lower extremities. Negative for open lesions or macerations.  Vascular: Palpable pedal pulses bilaterally. No edema or erythema noted. Capillary refill within normal limits.  Neurological: Epicritic and protective threshold grossly intact bilaterally.   Musculoskeletal Exam: Pain with palpation noted to the 5th MPJ of the right foot. Range of motion within normal limits to all pedal and ankle joints bilateral. Muscle strength 5/5 in all groups bilateral.   Assessment: 1. Chronic 5th MPJ capsulitis right    Plan of Care:  1. Patient evaluated.   2. Today we discussed the conservative versus surgical management of the presenting pathology. The patient opts for surgical management. All possible complications and details of the procedure were explained. All patient questions were answered. No guarantees were expressed or implied. 3. Authorization for surgery was initiated today. Surgery will consist of 5th MPJ capsulotomy right.  4. Return to clinic one week post op.      Felecia ShellingBrent M. [place] Willow, DPM [facility]  Dr. Felecia ShellingBrent M. [employer]aya Mcjunkins, DPM    [address]                                        [place], [place] [address]                Office [phone]  Fax [phone](336) [phone]

## 2017-12-30 DIAGNOSIS — L298 Other pruritus: Secondary | ICD-10-CM | POA: Diagnosis not present

## 2017-12-30 DIAGNOSIS — L538 Other specified erythematous conditions: Secondary | ICD-10-CM | POA: Diagnosis not present

## 2017-12-30 DIAGNOSIS — L820 Inflamed seborrheic keratosis: Secondary | ICD-10-CM | POA: Diagnosis not present

## 2018-01-06 ENCOUNTER — Other Ambulatory Visit: Payer: Self-pay | Admitting: Podiatry

## 2018-01-06 DIAGNOSIS — M2012 Hallux valgus (acquired), left foot: Secondary | ICD-10-CM | POA: Diagnosis not present

## 2018-01-06 DIAGNOSIS — M7752 Other enthesopathy of left foot: Secondary | ICD-10-CM | POA: Diagnosis not present

## 2018-01-06 DIAGNOSIS — M21611 Bunion of right foot: Secondary | ICD-10-CM | POA: Diagnosis not present

## 2018-01-06 DIAGNOSIS — M779 Enthesopathy, unspecified: Secondary | ICD-10-CM | POA: Diagnosis not present

## 2018-01-06 DIAGNOSIS — M21621 Bunionette of right foot: Secondary | ICD-10-CM | POA: Diagnosis not present

## 2018-01-06 MED ORDER — IBUPROFEN 800 MG PO TABS: 800.0000 mg | ORAL_TABLET | Freq: Three times a day (TID) | ORAL | 0 refills | Status: DC | PRN

## 2018-01-06 MED ORDER — OXYCODONE-ACETAMINOPHEN 5-325 MG PO TABS: 1.0000 | ORAL_TABLET | Freq: Four times a day (QID) | ORAL | 0 refills | Status: AC | PRN

## 2018-01-06 NOTE — Progress Notes (Signed)
Postop pain mgmt 

## 2018-01-13 ENCOUNTER — Encounter: Payer: Self-pay | Admitting: Podiatry

## 2018-01-13 ENCOUNTER — Ambulatory Visit (INDEPENDENT_AMBULATORY_CARE_PROVIDER_SITE_OTHER): Payer: 59 | Admitting: Podiatry

## 2018-01-13 ENCOUNTER — Ambulatory Visit (INDEPENDENT_AMBULATORY_CARE_PROVIDER_SITE_OTHER): Payer: 59

## 2018-01-13 VITALS — BP 127/98 | HR 97 | Temp 96.6°F

## 2018-01-13 DIAGNOSIS — Z9889 Other specified postprocedural states: Secondary | ICD-10-CM

## 2018-01-13 DIAGNOSIS — M7751 Other enthesopathy of right foot: Secondary | ICD-10-CM

## 2018-01-15 NOTE — Progress Notes (Signed)
   Subjective:  Patient presents today status post 5th MPJ capsulotomy. DOS: 01/06/18. She states she is doing well. She reports some moderate pain but states it is not as severe today. She has been using the CAM boot as directed. Patient is here for further evaluation and treatment.   History reviewed. No pertinent past medical history.    Objective/Physical Exam Neurovascular status intact.  Skin incisions appear to be well coapted with sutures and staples intact. No sign of infectious process noted. No dehiscence. No active bleeding noted. Moderate edema noted to the surgical extremity.  Radiographic Exam:  Osteotomies sites appear to be stable with routine healing.  Assessment: 1. s/p 5th MPJ surgery. DOS: 01/06/18   Plan of Care:  1. Patient was evaluated. X-rays reviewed 2. Dressing changed.  3. Post op shoe dispensed. Discontinue using CAM boot.  4. Return to clinic in one week for suture removal.    Felecia ShellingBrent M. Evans, DPM [facility]  Dr. Felecia ShellingBrent M. Evans, DPM    [address]                                        [place], [place] [address]                Office [phone]  Fax [phone]

## 2018-01-24 ENCOUNTER — Ambulatory Visit (INDEPENDENT_AMBULATORY_CARE_PROVIDER_SITE_OTHER): Payer: 59 | Admitting: Podiatry

## 2018-01-24 ENCOUNTER — Encounter: Payer: Self-pay | Admitting: Podiatry

## 2018-01-24 DIAGNOSIS — M7751 Other enthesopathy of right foot: Secondary | ICD-10-CM

## 2018-01-24 DIAGNOSIS — Z9889 Other specified postprocedural states: Secondary | ICD-10-CM

## 2018-01-24 MED ORDER — IBUPROFEN 800 MG PO TABS: 800.0000 mg | ORAL_TABLET | Freq: Three times a day (TID) | ORAL | 0 refills | Status: AC | PRN

## 2018-01-26 NOTE — Progress Notes (Signed)
   Subjective:  Patient presents today status post 5th MPJ capsulotomy. DOS: 01/06/18. She states she is doing well. She reports some intermittent sharp pain but states it is mild. She has been using the post op shoe as directed. Patient is here for further evaluation and treatment.   History reviewed. No pertinent past medical history.    Objective/Physical Exam Neurovascular status intact.  Skin incisions appear to be well coapted with sutures and staples intact. No sign of infectious process noted. No dehiscence. No active bleeding noted. Moderate edema noted to the surgical extremity.  Assessment: 1. s/p 5th MPJ surgery. DOS: 01/06/18   Plan of Care:  1. Patient was evaluated.  2. Sutures removed.  3. Transition out of post op shoe into good shoe gear.  4. Refill prescription for Motrin 800 mg provided to patient.  5. Return to clinic in 2 weeks.     Felecia Shelling, DPM [facility]  Dr. Felecia Shelling, DPM    [address]                                        [place], [place] [address]                Office [phone]  Fax [phone]

## 2018-02-07 ENCOUNTER — Encounter: Payer: Self-pay | Admitting: Podiatry

## 2018-02-07 ENCOUNTER — Ambulatory Visit (INDEPENDENT_AMBULATORY_CARE_PROVIDER_SITE_OTHER): Payer: 59 | Admitting: Podiatry

## 2018-02-07 DIAGNOSIS — M7751 Other enthesopathy of right foot: Secondary | ICD-10-CM

## 2018-02-07 DIAGNOSIS — Z9889 Other specified postprocedural states: Secondary | ICD-10-CM

## 2018-02-14 NOTE — Progress Notes (Signed)
   Subjective:  Patient presents today status post 5th MPJ capsulotomy. DOS: 01/06/18. She reports some mild soreness but states it has improved since her last visit. She has been taking Motrin for her symptoms. There are no modifying factors noted. Patient is here for further evaluation and treatment.   History reviewed. No pertinent past medical history.    Objective/Physical Exam Neurovascular status intact.  Skin incisions appear to be well coapted. No sign of infectious process noted. No dehiscence. No active bleeding noted. Moderate edema noted to the surgical extremity.  Assessment: 1. s/p 5th MPJ surgery. DOS: 01/06/18   Plan of Care:  1. Patient was evaluated.  2. Stressed ROM exercises to 5th MPJ.  3. Recommended good shoe gear.  4. Return to clinic in 4 weeks.     Felecia Shelling, DPM [facility]  Dr. Felecia Shelling, DPM    [address]                                        [place], [place] [address]                Office [phone]  Fax [phone]

## 2018-03-07 ENCOUNTER — Ambulatory Visit (INDEPENDENT_AMBULATORY_CARE_PROVIDER_SITE_OTHER): Payer: 59 | Admitting: Podiatry

## 2018-03-07 ENCOUNTER — Encounter: Payer: Self-pay | Admitting: Podiatry

## 2018-03-07 DIAGNOSIS — M7751 Other enthesopathy of right foot: Secondary | ICD-10-CM

## 2018-03-07 DIAGNOSIS — Z9889 Other specified postprocedural states: Secondary | ICD-10-CM

## 2018-03-09 NOTE — Progress Notes (Signed)
   Subjective:  Patient presents today status post 5th MPJ capsulotomy. DOS: 01/06/18. She states she is doing well. She reports a significant improvement since her last visit. She denies any pain or modifying factors. Patient is here for further evaluation and treatment.   No past medical history on file.    Objective/Physical Exam Neurovascular status intact.  Skin incisions appear to be well coapted. No sign of infectious process noted. No dehiscence. No active bleeding noted. Moderate edema noted to the surgical extremity.  Assessment: 1. s/p 5th MPJ surgery. DOS: 01/06/18   Plan of Care:  1. Patient was evaluated.  2. May resume full activity with no restrictions.  3. Recommended good shoe gear.  4. Return to clinic as needed.     Felecia Shelling, DPM [facility]  Dr. Felecia Shelling, DPM    [address]                                        [place], [place] [address]                Office [phone]  Fax [phone]

## 2018-03-11 ENCOUNTER — Emergency Department
Admission: EM | Admit: 2018-03-11 | Discharge: 2018-03-11 | Disposition: A | Discharge status: Home / Self Care | Payer: 59 | Attending: Emergency Medicine | Admitting: Emergency Medicine

## 2018-03-11 ENCOUNTER — Other Ambulatory Visit: Payer: Self-pay

## 2018-03-11 ENCOUNTER — Encounter: Payer: Self-pay | Admitting: Radiology

## 2018-03-11 ENCOUNTER — Emergency Department: Payer: 59

## 2018-03-11 DIAGNOSIS — Z79899 Other long term (current) drug therapy: Secondary | ICD-10-CM | POA: Diagnosis not present

## 2018-03-11 DIAGNOSIS — K529 Noninfective gastroenteritis and colitis, unspecified: Secondary | ICD-10-CM

## 2018-03-11 DIAGNOSIS — R109 Unspecified abdominal pain: Secondary | ICD-10-CM | POA: Diagnosis not present

## 2018-03-11 DIAGNOSIS — R112 Nausea with vomiting, unspecified: Secondary | ICD-10-CM | POA: Diagnosis not present

## 2018-03-11 DIAGNOSIS — R1031 Right lower quadrant pain: Secondary | ICD-10-CM | POA: Diagnosis not present

## 2018-03-11 LAB — CBC
HCT: 40.7 % (ref 36.0–46.0)
HEMOGLOBIN: 13.2 g/dL (ref 12.0–15.0)
MCH: 28.6 pg (ref 26.0–34.0)
MCHC: 32.4 g/dL (ref 30.0–36.0)
MCV: 88.3 fL (ref 80.0–100.0)
PLATELETS: 317 10*3/uL (ref 150–400)
RBC: 4.61 MIL/uL (ref 3.87–5.11)
RDW: 13.1 % (ref 11.5–15.5)
WBC: 8.5 10*3/uL (ref 4.0–10.5)
nRBC: 0 % (ref 0.0–0.2)

## 2018-03-11 LAB — COMPREHENSIVE METABOLIC PANEL
ALBUMIN: 4.2 g/dL (ref 3.5–5.0)
ALK PHOS: 56 U/L (ref 38–126)
ALT: 16 U/L (ref 0–44)
AST: 18 U/L (ref 15–41)
Anion gap: 7 (ref 5–15)
BILIRUBIN TOTAL: 0.6 mg/dL (ref 0.3–1.2)
BUN: 11 mg/dL (ref 6–20)
CALCIUM: 9 mg/dL (ref 8.9–10.3)
CO2: 26 mmol/L (ref 22–32)
CREATININE: 0.78 mg/dL (ref 0.44–1.00)
Chloride: 106 mmol/L (ref 98–111)
GFR calc Af Amer: >60 mL/min (ref >60)
GLUCOSE: 116 mg/dL — AB (ref 70–99)
Potassium: 4 mmol/L (ref 3.5–5.1)
Sodium: 139 mmol/L (ref 135–145)
TOTAL PROTEIN: 7.3 g/dL (ref 6.5–8.1)

## 2018-03-11 LAB — URINALYSIS, COMPLETE (UACMP) WITH MICROSCOPIC
BILIRUBIN URINE: NEGATIVE
Bacteria, UA: NONE SEEN
Glucose, UA: NEGATIVE mg/dL
HGB URINE DIPSTICK: NEGATIVE
Ketones, ur: NEGATIVE mg/dL
Leukocytes,Ua: NEGATIVE
Nitrite: NEGATIVE
PROTEIN: NEGATIVE mg/dL
SPECIFIC GRAVITY, URINE: 1.032 — AB (ref 1.005–1.030)
pH: 6 (ref 5.0–8.0)

## 2018-03-11 LAB — POCT PREGNANCY, URINE: PREG TEST UR: NEGATIVE

## 2018-03-11 MED ORDER — SODIUM CHLORIDE 0.9 % IV BOLUS
1000.0000 mL | Freq: Once | INTRAVENOUS | Status: AC
  Administered 2018-03-11: 1000 mL via INTRAVENOUS

## 2018-03-11 MED ORDER — HYDROMORPHONE HCL 1 MG/ML IJ SOLN
1.0000 mg | Freq: Once | INTRAMUSCULAR | Status: DC
  Filled 2018-03-11: qty 1

## 2018-03-11 MED ORDER — MORPHINE SULFATE (PF) 4 MG/ML IV SOLN
4.0000 mg | Freq: Once | INTRAVENOUS | Status: AC
  Administered 2018-03-11: 4 mg via INTRAVENOUS
  Filled 2018-03-11: qty 1

## 2018-03-11 MED ORDER — ONDANSETRON HCL 4 MG/2ML IJ SOLN
4.0000 mg | Freq: Once | INTRAMUSCULAR | Status: AC
  Administered 2018-03-11: 4 mg via INTRAVENOUS
  Filled 2018-03-11: qty 2

## 2018-03-11 MED ORDER — IOPAMIDOL (ISOVUE-300) INJECTION 61%
30.0000 mL | Freq: Once | INTRAVENOUS | Status: AC | PRN
  Administered 2018-03-11: 30 mL via ORAL

## 2018-03-11 MED ORDER — ONDANSETRON 4 MG PO TBDP: 4.0000 mg | ORAL_TABLET | Freq: Three times a day (TID) | ORAL | 0 refills | Status: AC | PRN

## 2018-03-11 MED ORDER — HYDROCODONE-ACETAMINOPHEN 5-325 MG PO TABS: 1.0000 | ORAL_TABLET | ORAL | 0 refills | Status: AC | PRN

## 2018-03-11 MED ORDER — ONDANSETRON 4 MG PO TBDP: 4.0000 mg | ORAL_TABLET | Freq: Three times a day (TID) | ORAL | 0 refills | Status: DC | PRN

## 2018-03-11 MED ORDER — DOCUSATE SODIUM 100 MG PO CAPS: 100.0000 mg | ORAL_CAPSULE | Freq: Two times a day (BID) | ORAL | 0 refills | Status: AC

## 2018-03-11 MED ORDER — IOHEXOL 300 MG/ML  SOLN
100.0000 mL | Freq: Once | INTRAMUSCULAR | Status: AC | PRN
  Administered 2018-03-11: 100 mL via INTRAVENOUS

## 2018-03-11 MED ORDER — DOCUSATE SODIUM 100 MG PO CAPS: 100.0000 mg | ORAL_CAPSULE | Freq: Two times a day (BID) | ORAL | 0 refills | Status: DC

## 2018-03-11 NOTE — Discharge Instructions (Addendum)
As we discussed please use your medications as needed, as written.  Please follow-up with your primary care doctor for recheck/reevaluation.  As we discussed your CT scan also shows a prominent cervix please discuss this with your OB/GYN.  Return to the emergency department for any worsening pain development of fever 101 or higher, or any other symptom personally concerning to yourself.

## 2018-03-11 NOTE — ED Notes (Signed)
Patient transported to CT 

## 2018-03-11 NOTE — ED Triage Notes (Signed)
Pt with RLQ pain since yesterday without nausea currently. Pt denies known fever. Last bowel movement yesterday, normal per pt.

## 2018-03-11 NOTE — ED Provider Notes (Signed)
[facility] Emergency Department Provider Note  Time seen: 7:28 AM  I have reviewed the triage vital signs and the nursing notes.   HISTORY  Chief Complaint Abdominal Pain    HPI Rachel [name] is a 53 y.o. female with a past medical history of anxiety, presents to the emergency department for right lower quadrant domino pain.  According to the patient over the past 24 hours she has developed right lower quadrant abdominal pain, moderate in severity aching/dull type pain.  Denies any fever, was nauseated with one episode of vomiting yesterday.  None today.  No diarrhea.  Normal bowel movement yesterday.  Denies dysuria, hematuria or history of kidney stones.   No past medical history on file.  Patient Active Problem List   Diagnosis Date Noted  . Chest pain with low risk for cardiac etiology 06/07/2017  . Borderline diabetes mellitus 11/29/2016  . Vitamin D deficiency 11/29/2016  . GAD (generalized anxiety disorder) 11/22/2016  . Vaccine counseling 11/22/2016  . Heart palpitations 10/23/2015  . Female genital symptoms 05/29/2013  . Other and unspecified ovarian cyst 05/29/2013    Past Surgical History:  Procedure Laterality Date  . ABLATION  2013  . BREAST CYST ASPIRATION Right 2015  . TUBAL LIGATION  2001    Prior to Admission medications   Medication Sig Start Date End Date Taking? Authorizing Provider  celecoxib (CELEBREX) 200 MG capsule Take 200 mg by mouth daily.    [provider]  ibuprofen (ADVIL,MOTRIN) 800 MG tablet Take 1 tablet (800 mg total) by mouth every 8 (eight) hours as needed. 01/24/18   Felecia Shelling, DPM  oxyCODONE-acetaminophen (PERCOCET) 5-325 MG tablet Take 1 tablet by mouth every 6 (six) hours as needed for severe pain. 01/06/18   Felecia Shelling, DPM  Vitamin D, Ergocalciferol, (DRISDOL) 50000 units CAPS capsule TAKE ONE CAPSULE BY MOUTH ONE TIME PER WEEK 09/27/17   [provider]    Allergies  Allergen  Reactions  . Sulfa Antibiotics Diarrhea  . Dexlansoprazole Diarrhea and Nausea Only    No family history on file.  Social History Social History   Tobacco Use  . Smoking status: Never Smoker  . Smokeless tobacco: Never Used  Substance Use Topics  . Alcohol use: No    Frequency: Never  . Drug use: No    Review of Systems Constitutional: Negative for fever. Cardiovascular: Negative for chest pain. Respiratory: Negative for shortness of breath. Gastrointestinal: Moderate right lower quadrant abdominal pain.  Negative for diarrhea.  Nausea with one episode of vomiting yesterday. Genitourinary: Negative for dysuria or hematuria. Musculoskeletal: Negative for musculoskeletal complaints Skin: Negative for skin complaints  Neurological: Negative for headache All other ROS negative  ____________________________________________   PHYSICAL EXAM:  VITAL SIGNS: ED Triage Vitals [03/11/18 0656]  Enc Vitals Group     BP 124/62     Pulse Rate 71     Resp 16     Temp 98.4 F (36.9 C)     Temp Source Oral     SpO2 100 %     Weight 170 lb (77.1 kg)     Height 5\' 4"  (1.626 m)     Head Circumference      Peak Flow      Pain Score 10     Pain Loc      Pain Edu?      Excl. in GC?    Constitutional: Alert and oriented. Well appearing and in no distress. Eyes:  Normal exam ENT   Head: Normocephalic and atraumatic.   Mouth/Throat: Mucous membranes are moist. Cardiovascular: Normal rate, regular rhythm. Respiratory: Normal respiratory effort without tachypnea nor retractions. Breath sounds are clear  Gastrointestinal: Soft, moderate right lower quadrant tenderness mostly focused over McBurney's point.  No rebound guarding or distention. Musculoskeletal: Nontender with normal range of motion in all extremities Neurologic:  Normal speech and language. No gross focal neurologic deficits Skin:  Skin is warm, dry and intact.  Psychiatric: Mood and affect are  normal.    RADIOLOGY  CT scan shows proximal small bowel wall thickening possible enteritis.  No obstruction.  Prominent cervix.  ____________________________________________   INITIAL IMPRESSION / ASSESSMENT AND PLAN / ED COURSE  Pertinent labs & imaging results that were available during my care of the patient were reviewed by me and considered in my medical decision making (see chart for details).  Patient presents emergency department for right lower quadrant abdominal pain beginning yesterday progressively worsening.  Moderate in severity currently.  Differential would include appendicitis, UTI/pyelonephritis, ureterolithiasis, colitis or diverticulitis, ovarian pathology.  We will check labs, likely proceed with CT imaging.  We will treat pain and nausea, IV hydrate while awaiting results.  CT most consistent with possible enteritis otherwise largely within normal limits.  Normal appendix.  We will place the patient on a short course of pain and nausea medication, I suggested drinking plenty of fluids and obtaining plenty of rest.  Patient agreeable to plan of care.  Appears much better after pain medication.  CT did show a prominent cervix I discussed this with the patient, she states she still follows up with her OB/GYN last had a Pap smear November, but will follow-up with OB regarding this. ____________________________________________   FINAL CLINICAL IMPRESSION(S) / ED DIAGNOSES  Right lower quadrant abdominal pain Enteritis   Minna Antis, MD 03/11/18 [phone]

## 2018-03-29 ENCOUNTER — Encounter: Payer: Self-pay | Admitting: Podiatry

## 2018-04-18 DIAGNOSIS — R7303 Prediabetes: Secondary | ICD-10-CM | POA: Diagnosis not present

## 2018-04-18 DIAGNOSIS — E559 Vitamin D deficiency, unspecified: Secondary | ICD-10-CM | POA: Diagnosis not present

## 2018-05-30 ENCOUNTER — Other Ambulatory Visit: Payer: Self-pay

## 2018-05-30 ENCOUNTER — Encounter: Payer: Self-pay | Admitting: Podiatry

## 2018-05-30 ENCOUNTER — Ambulatory Visit (INDEPENDENT_AMBULATORY_CARE_PROVIDER_SITE_OTHER): Payer: Commercial Managed Care - PPO

## 2018-05-30 ENCOUNTER — Ambulatory Visit (INDEPENDENT_AMBULATORY_CARE_PROVIDER_SITE_OTHER): Payer: Commercial Managed Care - PPO | Admitting: Podiatry

## 2018-05-30 VITALS — Temp 98.3°F

## 2018-05-30 DIAGNOSIS — M778 Other enthesopathies, not elsewhere classified: Secondary | ICD-10-CM

## 2018-05-30 DIAGNOSIS — M7741 Metatarsalgia, right foot: Secondary | ICD-10-CM | POA: Diagnosis not present

## 2018-05-30 DIAGNOSIS — M779 Enthesopathy, unspecified: Secondary | ICD-10-CM | POA: Diagnosis not present

## 2018-05-30 MED ORDER — MELOXICAM 15 MG PO TABS: 15.0000 mg | ORAL_TABLET | Freq: Every day | ORAL | 1 refills | Status: AC

## 2018-06-02 NOTE — Progress Notes (Signed)
   Subjective:  Patient presents today status post 5th MPJ capsulotomy right. DOS: 01/06/18. She states she is doing well regarding her surgery. She states the incision site is fine and has no complaints about it.  She reports some pain to the ball of the right foot that began 1-2 months ago. Walking increases the pain. She has been taking Meloxicam and needs a refill. Patient is here for further evaluation and treatment.   History reviewed. No pertinent past medical history.   Objective: Physical Exam General: The patient is alert and oriented x3 in no acute distress.  Dermatology: Skin is cool, dry and supple bilateral lower extremities. Negative for open lesions or macerations.  Vascular: Palpable pedal pulses bilaterally. No edema or erythema noted. Capillary refill within normal limits.  Neurological: Epicritic and protective threshold grossly intact bilaterally.   Musculoskeletal Exam: Pain with palpation noted to the third intermetatarsal space between the 3rd and 4th digits. All pedal and ankle joints range of motion within normal limits bilateral. Muscle strength 5/5 in all groups bilateral.   Radiographic Exam:  Normal osseous mineralization. Joint spaces preserved. No fracture/dislocation/boney destruction.   Assessment: 1. s/p 5th MPJ surgery. DOS: 01/06/18 - resolved 2. Metatarsalgia right foot 3. Capsulitis right foot   Plan of Care:  1. Patient was evaluated. X-Rays reviewed. 2. Injection of 0.5 mLs Celestone Soluspan injected between the 3rd and 4th digits of the right foot.  3. Refill prescription for Meloxicam provided to patient.  4. Met pads dispensed.  5. Return to clinic in 4 weeks.      Edrick Kins, DPM [facility]  Dr. Edrick Kins, [address]                                        [place], [place] [address]                Office [phone]  Fax [phone]

## 2018-06-20 ENCOUNTER — Encounter: Payer: Self-pay | Admitting: Podiatry

## 2018-06-20 ENCOUNTER — Ambulatory Visit: Payer: Commercial Managed Care - PPO | Admitting: Podiatry

## 2018-06-20 ENCOUNTER — Other Ambulatory Visit: Payer: Self-pay

## 2018-06-20 VITALS — Temp 98.4°F

## 2018-06-20 DIAGNOSIS — M778 Other enthesopathies, not elsewhere classified: Secondary | ICD-10-CM

## 2018-06-20 DIAGNOSIS — M779 Enthesopathy, unspecified: Secondary | ICD-10-CM | POA: Diagnosis not present

## 2018-06-20 DIAGNOSIS — M7741 Metatarsalgia, right foot: Secondary | ICD-10-CM

## 2018-06-22 NOTE — Progress Notes (Signed)
   Subjective:  53 year old female presenting today for follow up evaluation of right foot pain. She states the pain has improved some. She reports the most pain when she is walking. She has been taking Meloxicam and using metatarsal pads as directed. Patient is here for further evaluation and treatment.   No past medical history on file.   Objective: Physical Exam General: The patient is alert and oriented x3 in no acute distress.  Dermatology: Skin is cool, dry and supple bilateral lower extremities. Negative for open lesions or macerations.  Vascular: Palpable pedal pulses bilaterally. No edema or erythema noted. Capillary refill within normal limits.  Neurological: Epicritic and protective threshold grossly intact bilaterally.   Musculoskeletal Exam: Pain with palpation noted to the third intermetatarsal space between the 3rd and 4th digits. All pedal and ankle joints range of motion within normal limits bilateral. Muscle strength 5/5 in all groups bilateral.   Assessment: 1. s/p 5th MPJ surgery. DOS: 01/06/18 - resolved 2. Metatarsalgia right foot 3. Capsulitis right foot   Plan of Care:  1. Patient was evaluated.  2. Injection of 0.5 mLs Celestone Soluspan injected between the 3rd and 4th digits of the right foot.  3. Continue taking Meloxicam. 4. Continue using metatarsal pads.  5. Recommended good shoe gear.  6. Return to clinic as needed.      Felecia Shelling, DPM [facility]  Dr. Felecia Shelling, DPM    [address]                                        [place], [place] [address]                Office [phone]  Fax [phone]

## 2018-09-01 ENCOUNTER — Other Ambulatory Visit: Payer: Self-pay

## 2018-09-01 ENCOUNTER — Ambulatory Visit: Payer: Commercial Managed Care - PPO | Admitting: Podiatry

## 2018-09-05 ENCOUNTER — Ambulatory Visit (INDEPENDENT_AMBULATORY_CARE_PROVIDER_SITE_OTHER): Payer: Commercial Managed Care - PPO

## 2018-09-05 ENCOUNTER — Encounter: Payer: Self-pay | Admitting: Podiatry

## 2018-09-05 ENCOUNTER — Ambulatory Visit (INDEPENDENT_AMBULATORY_CARE_PROVIDER_SITE_OTHER): Payer: Commercial Managed Care - PPO | Admitting: Podiatry

## 2018-09-05 ENCOUNTER — Other Ambulatory Visit: Payer: Self-pay

## 2018-09-05 ENCOUNTER — Other Ambulatory Visit: Payer: Self-pay | Admitting: Podiatry

## 2018-09-05 DIAGNOSIS — M778 Other enthesopathies, not elsewhere classified: Secondary | ICD-10-CM

## 2018-09-05 DIAGNOSIS — M779 Enthesopathy, unspecified: Secondary | ICD-10-CM

## 2018-09-05 DIAGNOSIS — M7741 Metatarsalgia, right foot: Secondary | ICD-10-CM

## 2018-09-07 NOTE — Progress Notes (Signed)
   Subjective:  53 year old female presenting today for follow up evaluation of right foot pain. She reports significant pain of the foot stating it is like it was prior to surgery. Walking and being on the foot for long periods of time increases the pain. She states standing after being seated for a long period of time causes significant pain as well. She has been using the met pads as directed. Patient is here for further evaluation and treatment.   History reviewed. No pertinent past medical history.   Objective: Physical Exam General: The patient is alert and oriented x3 in no acute distress.  Dermatology: Skin is cool, dry and supple bilateral lower extremities. Negative for open lesions or macerations.  Vascular: Palpable pedal pulses bilaterally. No edema or erythema noted. Capillary refill within normal limits.  Neurological: Epicritic and protective threshold grossly intact bilaterally.   Musculoskeletal Exam: Pain with palpation noted to the third intermetatarsal space between the 3rd and 4th digits. All pedal and ankle joints range of motion within normal limits bilateral. Muscle strength 5/5 in all groups bilateral.   Radiographic Exam:  Normal osseous mineralization. Joint spaces preserved. No fracture/dislocation/boney destruction.    Assessment: 1. s/p 5th MPJ surgery. DOS: 01/06/18 - resolved 2. Metatarsalgia right foot 3. Capsulitis right foot   Plan of Care:  1. Patient was evaluated. X-Rays reviewed.  2. Additional met pads dispensed.  3. Appointment with Liliane Channel, Pedorthist, for custom molded orthotics.  4. Return to clinic as needed.    Edrick Kins, DPM [facility]  Dr. Edrick Kins, [address]                                        [place], [place] [address]                Office [phone]  Fax [phone]

## 2018-09-20 ENCOUNTER — Ambulatory Visit (INDEPENDENT_AMBULATORY_CARE_PROVIDER_SITE_OTHER): Payer: Commercial Managed Care - PPO | Admitting: Orthotics

## 2018-09-20 ENCOUNTER — Other Ambulatory Visit: Payer: Self-pay

## 2018-09-20 DIAGNOSIS — M7751 Other enthesopathy of right foot: Secondary | ICD-10-CM

## 2018-09-20 DIAGNOSIS — M7671 Peroneal tendinitis, right leg: Secondary | ICD-10-CM

## 2018-09-20 DIAGNOSIS — M7752 Other enthesopathy of left foot: Secondary | ICD-10-CM | POA: Diagnosis not present

## 2018-09-20 DIAGNOSIS — M778 Other enthesopathies, not elsewhere classified: Secondary | ICD-10-CM

## 2018-09-20 DIAGNOSIS — M7741 Metatarsalgia, right foot: Secondary | ICD-10-CM

## 2018-09-20 NOTE — Progress Notes (Signed)
Patient came into today to be cast for Custom Foot Orthotics. Upon recommendation of Dr. Amalia Hailey Patient presents with capsulitis R, as well as metartarsalgia Goals are arch support forefoot cushioning Plan vendor [employer]

## 2018-09-27 ENCOUNTER — Ambulatory Visit: Admit: 2018-09-27 | Discharge: 2018-09-28 | Payer: PRIVATE HEALTH INSURANCE

## 2018-09-27 ENCOUNTER — Telehealth: Payer: Self-pay | Admitting: Podiatry

## 2018-09-27 DIAGNOSIS — M7522 Bicipital tendinitis, left shoulder: Secondary | ICD-10-CM

## 2018-09-27 DIAGNOSIS — M19012 Primary osteoarthritis, left shoulder: Secondary | ICD-10-CM

## 2018-09-27 DIAGNOSIS — M7542 Impingement syndrome of left shoulder: Secondary | ICD-10-CM

## 2018-09-27 DIAGNOSIS — M25512 Pain in left shoulder: Secondary | ICD-10-CM

## 2018-09-27 DIAGNOSIS — M7582 Other shoulder lesions, left shoulder: Secondary | ICD-10-CM

## 2018-09-27 DIAGNOSIS — Z79899 Other long term (current) drug therapy: Secondary | ICD-10-CM

## 2018-09-27 NOTE — Telephone Encounter (Signed)
Pt is requesting Medical Records, Please call her to follow up

## 2018-10-05 ENCOUNTER — Ambulatory Visit: Admit: 2018-10-05 | Discharge: 2018-10-06 | Payer: PRIVATE HEALTH INSURANCE

## 2018-10-05 DIAGNOSIS — M65879 Other synovitis and tenosynovitis, unspecified ankle and foot: Secondary | ICD-10-CM

## 2018-10-05 DIAGNOSIS — M79671 Pain in right foot: Secondary | ICD-10-CM

## 2018-10-11 MED ORDER — ETODOLAC 400 MG TABLET
ORAL_TABLET | Freq: Two times a day (BID) | ORAL | 2 refills | 14.00000 days | Status: CP
Start: 2018-10-11 — End: ?

## 2018-10-18 ENCOUNTER — Other Ambulatory Visit: Payer: Commercial Managed Care - PPO | Admitting: Orthotics

## 2018-10-25 ENCOUNTER — Other Ambulatory Visit: Payer: Commercial Managed Care - PPO | Admitting: Orthotics

## 2018-10-27 ENCOUNTER — Ambulatory Visit: Admit: 2018-10-27 | Discharge: 2018-10-28 | Payer: PRIVATE HEALTH INSURANCE

## 2018-10-27 DIAGNOSIS — M79671 Pain in right foot: Secondary | ICD-10-CM

## 2018-10-27 DIAGNOSIS — M85671 Other cyst of bone, right ankle and foot: Secondary | ICD-10-CM

## 2018-11-13 ENCOUNTER — Other Ambulatory Visit: Payer: Self-pay | Admitting: Obstetrics and Gynecology

## 2018-11-13 ENCOUNTER — Other Ambulatory Visit: Payer: Self-pay | Admitting: Family Medicine

## 2018-11-13 DIAGNOSIS — Z1231 Encounter for screening mammogram for malignant neoplasm of breast: Secondary | ICD-10-CM

## 2018-11-20 ENCOUNTER — Ambulatory Visit: Admit: 2018-11-20 | Discharge: 2018-11-21 | Payer: PRIVATE HEALTH INSURANCE

## 2018-11-20 MED ORDER — DICLOFENAC 1 % TOPICAL GEL
Freq: Two times a day (BID) | TOPICAL | 3 refills | 25 days | Status: CP
Start: 2018-11-20 — End: 2019-11-20

## 2018-11-21 ENCOUNTER — Other Ambulatory Visit: Payer: Self-pay | Admitting: Family Medicine

## 2018-11-21 DIAGNOSIS — Z1231 Encounter for screening mammogram for malignant neoplasm of breast: Secondary | ICD-10-CM

## 2018-12-12 ENCOUNTER — Ambulatory Visit
Admission: RE | Admit: 2018-12-12 | Discharge: 2018-12-12 | Disposition: A | Discharge status: Home / Self Care | Payer: Commercial Managed Care - PPO | Source: Ambulatory Visit | Attending: Family Medicine | Admitting: Family Medicine

## 2018-12-12 DIAGNOSIS — Z1231 Encounter for screening mammogram for malignant neoplasm of breast: Secondary | ICD-10-CM | POA: Diagnosis present

## 2018-12-26 ENCOUNTER — Ambulatory Visit: Admit: 2018-12-26 | Discharge: 2018-12-27 | Payer: PRIVATE HEALTH INSURANCE

## 2019-01-04 ENCOUNTER — Ambulatory Visit: Admit: 2019-01-04 | Discharge: 2019-01-05 | Payer: PRIVATE HEALTH INSURANCE

## 2019-01-30 ENCOUNTER — Ambulatory Visit
Admit: 2019-01-30 | Discharge: 2019-01-31 | Payer: PRIVATE HEALTH INSURANCE | Attending: Internal Medicine | Primary: Internal Medicine

## 2019-01-30 DIAGNOSIS — M7502 Adhesive capsulitis of left shoulder: Principal | ICD-10-CM

## 2019-01-30 MED ORDER — CELECOXIB 200 MG CAPSULE
ORAL_CAPSULE | Freq: Two times a day (BID) | ORAL | 1 refills | 30 days | Status: CP
Start: 2019-01-30 — End: 2019-03-01

## 2019-03-09 ENCOUNTER — Ambulatory Visit
Admit: 2019-03-09 | Discharge: 2019-03-10 | Payer: PRIVATE HEALTH INSURANCE | Attending: Internal Medicine | Primary: Internal Medicine

## 2019-03-09 DIAGNOSIS — M7502 Adhesive capsulitis of left shoulder: Principal | ICD-10-CM

## 2019-03-24 ENCOUNTER — Ambulatory Visit: Admit: 2019-03-24 | Discharge: 2019-03-25 | Payer: PRIVATE HEALTH INSURANCE

## 2019-04-14 ENCOUNTER — Ambulatory Visit: Admit: 2019-04-14 | Discharge: 2019-04-15 | Payer: PRIVATE HEALTH INSURANCE

## 2019-04-20 ENCOUNTER — Ambulatory Visit
Admit: 2019-04-20 | Discharge: 2019-04-21 | Payer: PRIVATE HEALTH INSURANCE | Attending: Internal Medicine | Primary: Internal Medicine

## 2019-04-20 DIAGNOSIS — M7502 Adhesive capsulitis of left shoulder: Principal | ICD-10-CM

## 2019-07-05 ENCOUNTER — Ambulatory Visit: Admit: 2019-07-05 | Discharge: 2019-07-06 | Payer: PRIVATE HEALTH INSURANCE

## 2019-07-30 ENCOUNTER — Ambulatory Visit
Admit: 2019-07-30 | Discharge: 2019-07-31 | Payer: PRIVATE HEALTH INSURANCE | Attending: Student in an Organized Health Care Education/Training Program | Primary: Student in an Organized Health Care Education/Training Program

## 2019-07-30 DIAGNOSIS — W57XXXA Bitten or stung by nonvenomous insect and other nonvenomous arthropods, initial encounter: Principal | ICD-10-CM

## 2019-07-30 DIAGNOSIS — D492 Neoplasm of unspecified behavior of bone, soft tissue, and skin: Principal | ICD-10-CM

## 2019-07-30 MED ORDER — CLOBETASOL 0.05 % TOPICAL OINTMENT
1 refills | 0 days | Status: CP
Start: 2019-07-30 — End: ?

## 2019-10-08 ENCOUNTER — Ambulatory Visit
Admit: 2019-10-08 | Discharge: 2019-10-09 | Payer: PRIVATE HEALTH INSURANCE | Attending: Emergency Medicine | Primary: Emergency Medicine

## 2019-10-08 DIAGNOSIS — S0990XA Unspecified injury of head, initial encounter: Principal | ICD-10-CM

## 2019-10-08 DIAGNOSIS — H6591 Unspecified nonsuppurative otitis media, right ear: Principal | ICD-10-CM

## 2019-10-17 ENCOUNTER — Ambulatory Visit: Admit: 2019-10-17 | Discharge: 2019-10-18 | Payer: PRIVATE HEALTH INSURANCE | Attending: Family | Primary: Family

## 2019-10-17 DIAGNOSIS — H9201 Otalgia, right ear: Principal | ICD-10-CM

## 2019-10-17 DIAGNOSIS — R0981 Nasal congestion: Principal | ICD-10-CM

## 2019-10-17 MED ORDER — FLUTICASONE PROPIONATE 50 MCG/ACTUATION NASAL SPRAY,SUSPENSION
Freq: Every day | NASAL | 2 refills | 0 days | Status: CP
Start: 2019-10-17 — End: 2020-10-16

## 2019-10-23 ENCOUNTER — Ambulatory Visit: Admit: 2019-10-23 | Discharge: 2019-10-24 | Payer: PRIVATE HEALTH INSURANCE

## 2019-10-23 DIAGNOSIS — R1031 Right lower quadrant pain: Principal | ICD-10-CM

## 2019-10-23 MED ORDER — NORETHINDRONE (CONTRACEPTIVE) 0.35 MG TABLET
ORAL_TABLET | Freq: Every day | ORAL | 12 refills | 28 days | Status: CP
Start: 2019-10-23 — End: ?

## 2019-10-23 MED ORDER — DIAZEPAM 2 MG TABLET
ORAL_TABLET | Freq: Once | ORAL | 0 refills | 0.00000 days | Status: CP | PRN
Start: 2019-10-23 — End: ?

## 2019-10-23 MED ORDER — MEDROXYPROGESTERONE 2.5 MG TABLET
ORAL_TABLET | Freq: Every day | ORAL | 11 refills | 60.00000 days | Status: CP
Start: 2019-10-23 — End: 2019-10-23

## 2019-10-29 MED ORDER — NAPROXEN 500 MG TABLET
ORAL_TABLET | Freq: Two times a day (BID) | ORAL | 2 refills | 15.00000 days | Status: CP
Start: 2019-10-29 — End: 2020-10-28

## 2019-10-31 ENCOUNTER — Ambulatory Visit: Admit: 2019-10-31 | Discharge: 2019-11-01 | Payer: PRIVATE HEALTH INSURANCE

## 2019-11-01 DIAGNOSIS — R1031 Right lower quadrant pain: Principal | ICD-10-CM

## 2019-11-12 ENCOUNTER — Ambulatory Visit: Admit: 2019-11-12 | Discharge: 2019-11-13 | Payer: PRIVATE HEALTH INSURANCE

## 2019-11-12 DIAGNOSIS — N644 Mastodynia: Principal | ICD-10-CM

## 2019-11-12 DIAGNOSIS — N800 Endometriosis of uterus: Principal | ICD-10-CM

## 2019-11-12 DIAGNOSIS — R102 Pelvic and perineal pain: Principal | ICD-10-CM

## 2019-11-14 ENCOUNTER — Ambulatory Visit: Admit: 2019-11-14 | Discharge: 2019-11-15 | Payer: PRIVATE HEALTH INSURANCE

## 2019-11-14 DIAGNOSIS — Z01818 Encounter for other preprocedural examination: Principal | ICD-10-CM

## 2019-11-14 DIAGNOSIS — R102 Pelvic and perineal pain: Principal | ICD-10-CM

## 2019-11-14 DIAGNOSIS — Z3202 Encounter for pregnancy test, result negative: Principal | ICD-10-CM

## 2019-12-07 ENCOUNTER — Other Ambulatory Visit: Payer: Self-pay | Admitting: Family Medicine

## 2019-12-07 DIAGNOSIS — Z1231 Encounter for screening mammogram for malignant neoplasm of breast: Secondary | ICD-10-CM

## 2019-12-11 ENCOUNTER — Ambulatory Visit: Admit: 2019-12-11 | Discharge: 2019-12-11 | Payer: PRIVATE HEALTH INSURANCE

## 2019-12-11 DIAGNOSIS — R102 Pelvic and perineal pain: Principal | ICD-10-CM

## 2019-12-11 DIAGNOSIS — Z882 Allergy status to sulfonamides status: Principal | ICD-10-CM

## 2019-12-11 DIAGNOSIS — N800 Endometriosis of uterus: Principal | ICD-10-CM

## 2019-12-11 DIAGNOSIS — D259 Leiomyoma of uterus, unspecified: Principal | ICD-10-CM

## 2019-12-11 DIAGNOSIS — Z79899 Other long term (current) drug therapy: Principal | ICD-10-CM

## 2019-12-11 DIAGNOSIS — Z01818 Encounter for other preprocedural examination: Principal | ICD-10-CM

## 2019-12-11 DIAGNOSIS — F419 Anxiety disorder, unspecified: Principal | ICD-10-CM

## 2019-12-12 DIAGNOSIS — Z01818 Encounter for other preprocedural examination: Principal | ICD-10-CM

## 2019-12-17 ENCOUNTER — Ambulatory Visit
Admit: 2019-12-17 | Discharge: 2019-12-18 | Payer: PRIVATE HEALTH INSURANCE | Attending: Physician Assistant | Primary: Physician Assistant

## 2019-12-17 DIAGNOSIS — Z01818 Encounter for other preprocedural examination: Principal | ICD-10-CM

## 2019-12-19 ENCOUNTER — Ambulatory Visit: Admit: 2019-12-19 | Discharge: 2019-12-20 | Payer: PRIVATE HEALTH INSURANCE

## 2019-12-19 ENCOUNTER — Encounter
Admit: 2019-12-19 | Discharge: 2019-12-20 | Payer: PRIVATE HEALTH INSURANCE | Attending: Anesthesiology | Primary: Anesthesiology

## 2019-12-19 DIAGNOSIS — Z882 Allergy status to sulfonamides status: Principal | ICD-10-CM

## 2019-12-19 DIAGNOSIS — R102 Pelvic and perineal pain: Principal | ICD-10-CM

## 2019-12-19 DIAGNOSIS — D259 Leiomyoma of uterus, unspecified: Principal | ICD-10-CM

## 2019-12-19 DIAGNOSIS — Z79899 Other long term (current) drug therapy: Principal | ICD-10-CM

## 2019-12-19 DIAGNOSIS — F419 Anxiety disorder, unspecified: Principal | ICD-10-CM

## 2019-12-20 MED ORDER — OXYCODONE 5 MG TABLET
ORAL_TABLET | ORAL | 0 refills | 2.00000 days | Status: CP | PRN
Start: 2019-12-20 — End: 2019-12-25

## 2019-12-20 MED ORDER — DOCUSATE SODIUM 100 MG CAPSULE
ORAL_CAPSULE | Freq: Every day | ORAL | 0 refills | 30.00000 days | Status: CP
Start: 2019-12-20 — End: 2020-01-19

## 2019-12-20 MED ORDER — ACETAMINOPHEN 325 MG TABLET
ORAL_TABLET | Freq: Four times a day (QID) | ORAL | 0 refills | 4.00000 days | Status: CP | PRN
Start: 2019-12-20 — End: ?

## 2019-12-20 MED ORDER — IBUPROFEN 600 MG TABLET
ORAL_TABLET | Freq: Four times a day (QID) | ORAL | 0 refills | 8 days | Status: CP | PRN
Start: 2019-12-20 — End: 2020-12-19

## 2019-12-28 ENCOUNTER — Ambulatory Visit: Admit: 2019-12-28 | Discharge: 2019-12-29 | Payer: PRIVATE HEALTH INSURANCE

## 2019-12-28 DIAGNOSIS — R300 Dysuria: Principal | ICD-10-CM

## 2019-12-28 DIAGNOSIS — Z90710 Acquired absence of both cervix and uterus: Principal | ICD-10-CM

## 2019-12-28 DIAGNOSIS — Z09 Encounter for follow-up examination after completed treatment for conditions other than malignant neoplasm: Principal | ICD-10-CM

## 2020-01-01 ENCOUNTER — Other Ambulatory Visit: Payer: Self-pay | Admitting: Family Medicine

## 2020-01-01 DIAGNOSIS — S0990XS Unspecified injury of head, sequela: Secondary | ICD-10-CM

## 2020-01-10 ENCOUNTER — Ambulatory Visit: Payer: Commercial Managed Care - PPO | Attending: Family Medicine

## 2020-01-15 ENCOUNTER — Other Ambulatory Visit: Payer: Self-pay

## 2020-01-15 ENCOUNTER — Ambulatory Visit
Admission: RE | Admit: 2020-01-15 | Discharge: 2020-01-15 | Disposition: A | Discharge status: Home / Self Care | Payer: Commercial Managed Care - PPO | Source: Ambulatory Visit | Attending: Family Medicine | Admitting: Family Medicine

## 2020-01-15 DIAGNOSIS — Z1231 Encounter for screening mammogram for malignant neoplasm of breast: Secondary | ICD-10-CM | POA: Diagnosis present

## 2020-01-15 IMAGING — MR MR FOOT*R* W/O CM
5 series · 40 of 40 positions shown · non-contrast
Comparison: Plain films right foot 09/23/2017.

CLINICAL DATA: Lateral right foot pain for 6 months. The patient
reports recurrent twisting injuries.

EXAM:
MRI OF THE RIGHT FOREFOOT WITHOUT CONTRAST
TECHNIQUE: Multiplanar, multisequence MR imaging of the right forefoot was
performed. No intravenous contrast was administered.

[Series 3: T1 · coronal · 3.0mm · 0.53mm/px · 10 of 50 slices shown (1 of 2)]
[im 1/50]
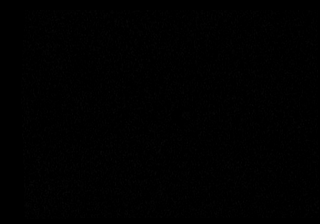
[im 6/50]
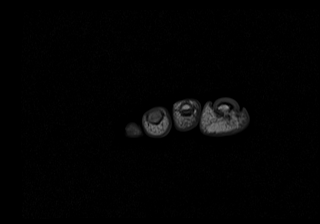
[im 11/50]
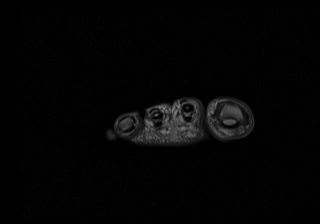
[im 17/50]
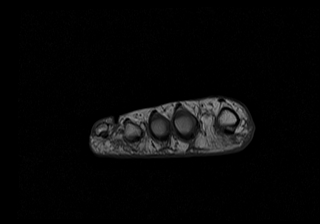
[im 22/50]
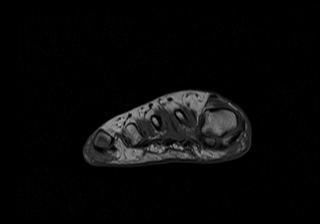
[im 28/50]
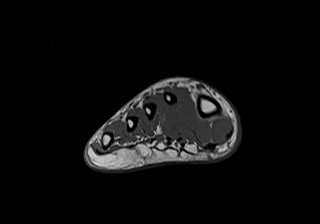
[im 33/50]
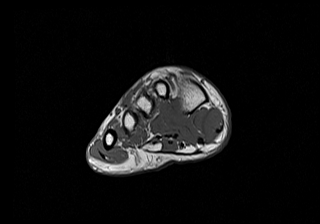
[im 39/50]
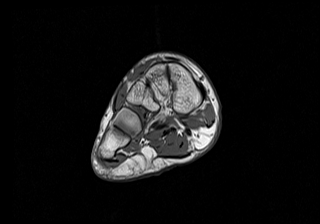
[im 44/50]
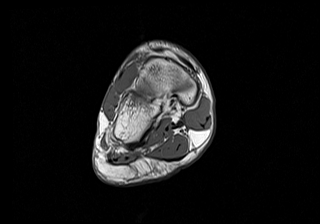
[im 50/50]
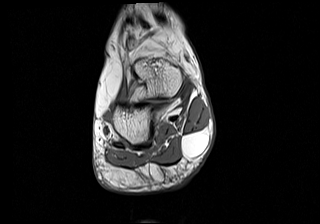

[Series 4: T2 fat-sat · coronal · 3.0mm · 0.53mm/px · 11 of 50 slices shown (1 of 2)]
[im 1/50]
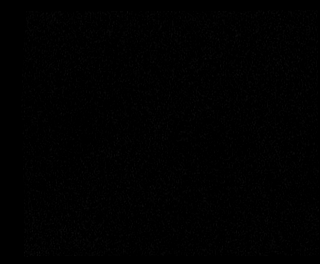
[im 5/50]
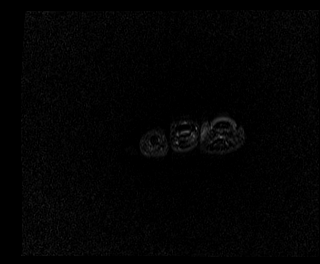
[im 10/50]
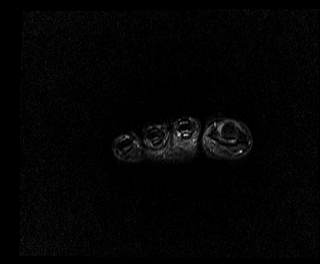
[im 15/50]
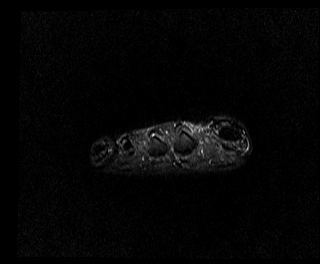
[im 20/50]
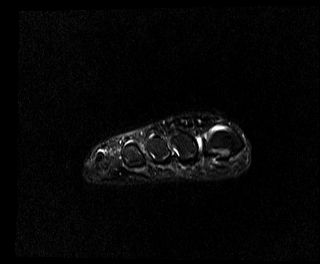
[im 25/50]
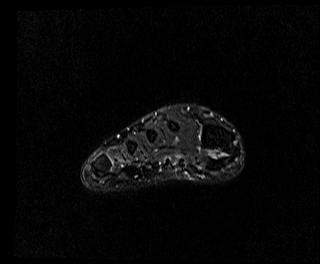
[im 30/50]
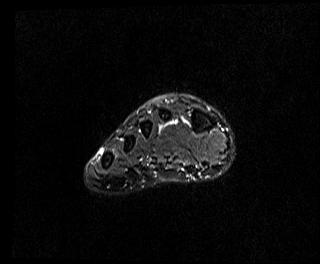
[im 35/50]
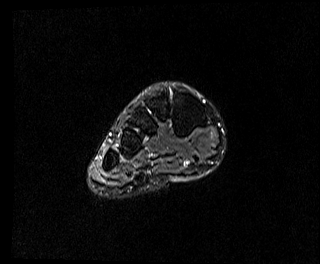
[im 40/50]
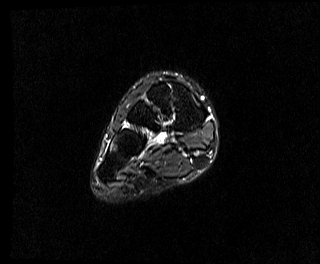
[im 45/50]
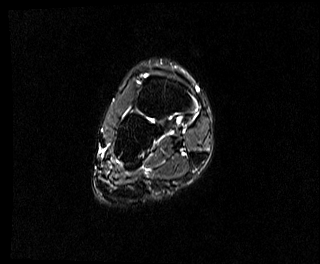
[im 50/50]
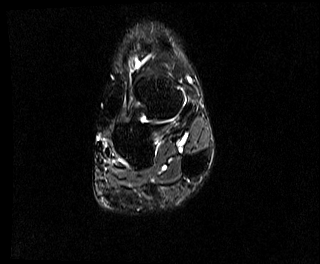

[Series 5: T1 · axial · 3.0mm · 0.53mm/px · z∈[-146,-63]mm · 6 of 27 slices shown (2 of 2)]
[im 1/27]
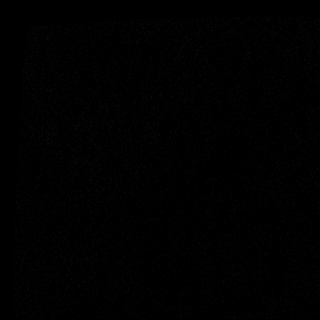
[im 6/27]
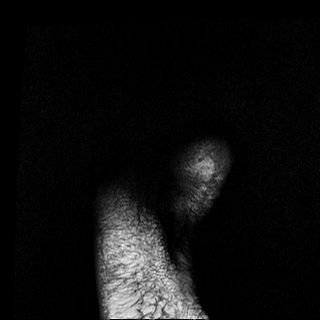
[im 11/27]
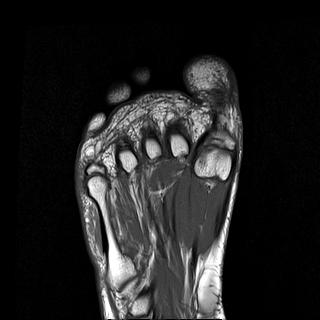
[im 16/27]
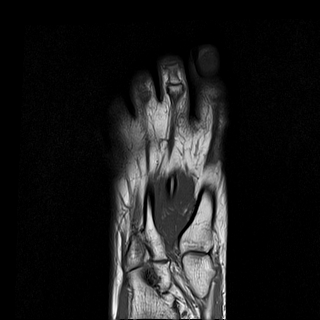
[im 21/27]
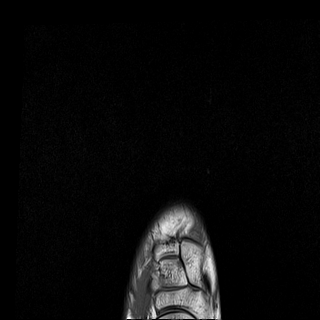
[im 27/27]
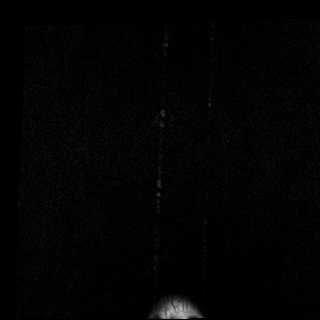

[Series 6: T2 fat-sat · axial · 3.0mm · 0.66mm/px · z∈[-146,-63]mm · 6 of 27 slices shown (2 of 2)]
[im 1/27]
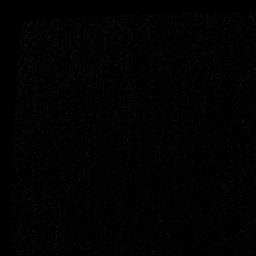
[im 6/27]
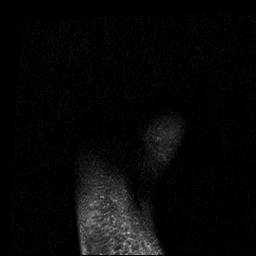
[im 11/27]
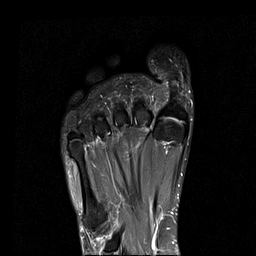
[im 16/27]
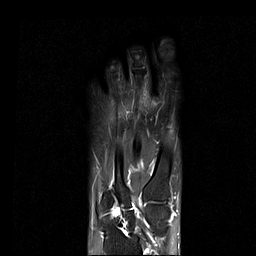
[im 21/27]
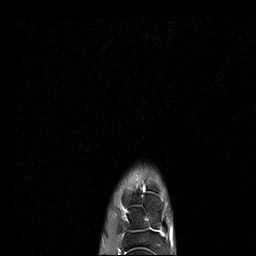
[im 27/27]
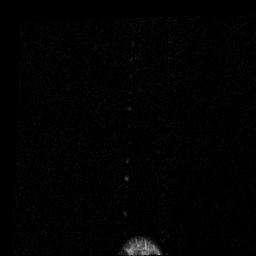

[Series 7: STIR · sagittal · 3.0mm · 0.70mm/px · 7 of 30 slices shown]
[im 1/30]
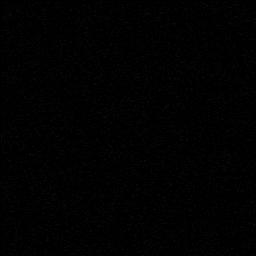
[im 5/30]
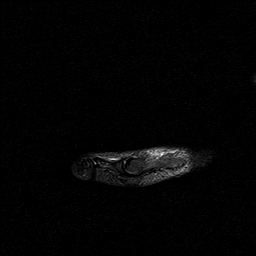
[im 10/30]
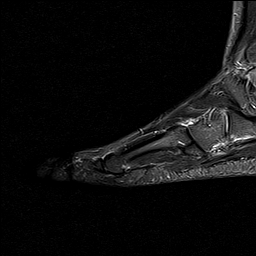
[im 15/30]
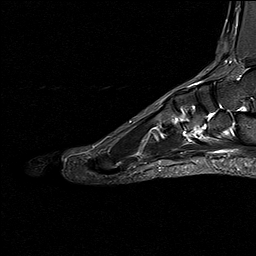
[im 20/30]
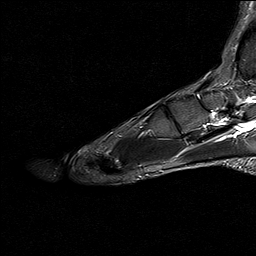
[im 25/30]
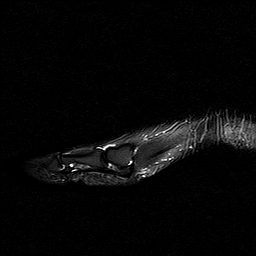
[im 30/30]
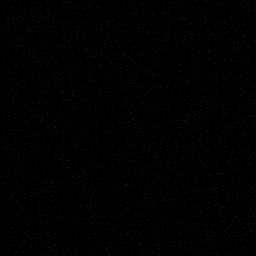

[40 of 40 positions shown; findings below may reference images not displayed]

FINDINGS: Bones/Joint/Cartilage

A tiny subchondral cyst is seen in the middle cuneiform at its
articulation with the navicular. Marrow signal is otherwise normal
throughout without fracture, stress change or focal lesion. No
evidence of arthropathy.

Ligaments

The Lisfranc ligament and all other imaged ligaments are intact.

Muscles and Tendons

Intact and normal in appearance.

Soft tissues

Normal.  No fluid collection or mass.
IMPRESSION: No evidence of trauma or finding to explain the patient's symptoms.

Tiny subchondral cyst in the middle cuneiform at its articulation
with the navicular consistent with very mild degenerative disease.

## 2020-01-24 ENCOUNTER — Ambulatory Visit: Admit: 2020-01-24 | Discharge: 2020-01-25 | Payer: PRIVATE HEALTH INSURANCE

## 2020-01-24 DIAGNOSIS — Z09 Encounter for follow-up examination after completed treatment for conditions other than malignant neoplasm: Principal | ICD-10-CM

## 2020-01-24 DIAGNOSIS — Z90710 Acquired absence of both cervix and uterus: Principal | ICD-10-CM

## 2020-02-06 DIAGNOSIS — F32A Depression, unspecified depression type: Principal | ICD-10-CM

## 2020-02-06 MED ORDER — ESCITALOPRAM 10 MG TABLET
ORAL_TABLET | Freq: Every day | ORAL | 4 refills | 30 days | Status: CP
Start: 2020-02-06 — End: 2020-03-07

## 2020-02-28 DIAGNOSIS — S0990XS Unspecified injury of head, sequela: Principal | ICD-10-CM

## 2020-03-07 ENCOUNTER — Ambulatory Visit: Admit: 2020-03-07 | Discharge: 2020-03-08 | Payer: PRIVATE HEALTH INSURANCE

## 2020-08-06 ENCOUNTER — Ambulatory Visit
Admit: 2020-08-06 | Discharge: 2020-08-06 | Disposition: A | Discharge status: Home / Self Care | Payer: PRIVATE HEALTH INSURANCE

## 2020-08-06 ENCOUNTER — Emergency Department
Admit: 2020-08-06 | Discharge: 2020-08-06 | Disposition: A | Discharge status: Home / Self Care | Payer: PRIVATE HEALTH INSURANCE

## 2020-08-06 DIAGNOSIS — R0789 Other chest pain: Principal | ICD-10-CM

## 2020-08-06 MED ORDER — CYCLOBENZAPRINE 10 MG TABLET
ORAL_TABLET | Freq: Two times a day (BID) | ORAL | 0 refills | 5 days | Status: CP | PRN
Start: 2020-08-06 — End: ?

## 2020-08-06 MED ORDER — NAPROXEN 500 MG TABLET
ORAL_TABLET | Freq: Two times a day (BID) | ORAL | 0 refills | 15 days | Status: CP
Start: 2020-08-06 — End: 2020-08-21

## 2020-10-14 ENCOUNTER — Telehealth
Admit: 2020-10-14 | Discharge: 2020-10-15 | Payer: PRIVATE HEALTH INSURANCE | Attending: Physician Assistant | Primary: Physician Assistant

## 2020-10-14 DIAGNOSIS — Z7689 Persons encountering health services in other specified circumstances: Principal | ICD-10-CM

## 2020-10-14 DIAGNOSIS — E669 Obesity, unspecified: Principal | ICD-10-CM

## 2020-10-14 DIAGNOSIS — Z6830 Body mass index (BMI) 30.0-30.9, adult: Principal | ICD-10-CM

## 2020-10-14 DIAGNOSIS — R7303 Prediabetes: Principal | ICD-10-CM

## 2020-10-14 DIAGNOSIS — E785 Hyperlipidemia, unspecified: Principal | ICD-10-CM

## 2020-10-14 DIAGNOSIS — R635 Abnormal weight gain: Principal | ICD-10-CM

## 2020-10-14 MED ORDER — MOUNJARO 2.5 MG/0.5 ML SUBCUTANEOUS PEN INJECTOR
SUBCUTANEOUS | 0 refills | 0 days | Status: CP
Start: 2020-10-14 — End: ?

## 2020-11-04 MED ORDER — MOUNJARO 5 MG/0.5 ML SUBCUTANEOUS PEN INJECTOR
SUBCUTANEOUS | 1 refills | 0 days | Status: CP
Start: 2020-11-04 — End: ?

## 2020-12-04 ENCOUNTER — Telehealth
Admit: 2020-12-04 | Discharge: 2020-12-05 | Payer: PRIVATE HEALTH INSURANCE | Attending: Physician Assistant | Primary: Physician Assistant

## 2020-12-04 DIAGNOSIS — R7303 Prediabetes: Principal | ICD-10-CM

## 2020-12-04 DIAGNOSIS — E785 Hyperlipidemia, unspecified: Principal | ICD-10-CM

## 2020-12-04 DIAGNOSIS — Z7689 Persons encountering health services in other specified circumstances: Principal | ICD-10-CM

## 2020-12-04 DIAGNOSIS — R635 Abnormal weight gain: Principal | ICD-10-CM

## 2020-12-04 MED ORDER — MOUNJARO 5 MG/0.5 ML SUBCUTANEOUS PEN INJECTOR
SUBCUTANEOUS | 3 refills | 0 days | Status: CP
Start: 2020-12-04 — End: ?

## 2021-02-23 DIAGNOSIS — Z1231 Encounter for screening mammogram for malignant neoplasm of breast: Principal | ICD-10-CM

## 2021-02-27 ENCOUNTER — Ambulatory Visit: Admit: 2021-02-27 | Discharge: 2021-02-28 | Payer: PRIVATE HEALTH INSURANCE

## 2021-03-03 ENCOUNTER — Ambulatory Visit: Admit: 2021-03-03 | Discharge: 2021-03-04 | Payer: PRIVATE HEALTH INSURANCE

## 2021-03-03 DIAGNOSIS — N951 Menopausal and female climacteric states: Principal | ICD-10-CM

## 2021-03-03 DIAGNOSIS — Z90710 Acquired absence of both cervix and uterus: Principal | ICD-10-CM

## 2021-03-03 MED ORDER — CONJUGATED ESTROGENS 0.625 MG TABLET
ORAL_TABLET | Freq: Every day | ORAL | 6 refills | 30 days | Status: CP
Start: 2021-03-03 — End: 2022-03-03

## 2021-03-10 ENCOUNTER — Ambulatory Visit: Admit: 2021-03-10 | Discharge: 2021-03-11 | Payer: PRIVATE HEALTH INSURANCE

## 2021-03-31 ENCOUNTER — Ambulatory Visit: Admit: 2021-03-31 | Discharge: 2021-04-01 | Payer: PRIVATE HEALTH INSURANCE

## 2021-03-31 ENCOUNTER — Ambulatory Visit: Admit: 2021-03-31 | Discharge: 2021-04-01 | Payer: PRIVATE HEALTH INSURANCE | Attending: Family | Primary: Family

## 2021-04-21 ENCOUNTER — Telehealth
Admit: 2021-04-21 | Discharge: 2021-04-22 | Payer: PRIVATE HEALTH INSURANCE | Attending: Physician Assistant | Primary: Physician Assistant

## 2021-04-21 DIAGNOSIS — E785 Hyperlipidemia, unspecified: Principal | ICD-10-CM

## 2021-04-21 DIAGNOSIS — Z6827 Body mass index (BMI) 27.0-27.9, adult: Principal | ICD-10-CM

## 2021-04-21 DIAGNOSIS — R635 Abnormal weight gain: Principal | ICD-10-CM

## 2021-04-21 DIAGNOSIS — R7303 Prediabetes: Principal | ICD-10-CM

## 2021-04-21 DIAGNOSIS — E663 Overweight: Principal | ICD-10-CM

## 2021-04-21 MED ORDER — MOUNJARO 5 MG/0.5 ML SUBCUTANEOUS PEN INJECTOR
SUBCUTANEOUS | 2 refills | 0 days | Status: CP
Start: 2021-04-21 — End: ?

## 2021-04-21 MED ORDER — WEGOVY 0.25 MG/0.5 ML SUBCUTANEOUS PEN INJECTOR
SUBCUTANEOUS | 0 refills | 0 days | Status: CP
Start: 2021-04-21 — End: ?

## 2021-04-30 ENCOUNTER — Ambulatory Visit: Admit: 2021-04-30 | Discharge: 2021-05-01 | Payer: PRIVATE HEALTH INSURANCE

## 2021-04-30 DIAGNOSIS — M25461 Effusion, right knee: Principal | ICD-10-CM

## 2021-05-04 ENCOUNTER — Ambulatory Visit: Admit: 2021-05-04 | Discharge: 2021-05-05 | Payer: PRIVATE HEALTH INSURANCE

## 2021-05-05 MED ORDER — WEGOVY 0.5 MG/0.5 ML SUBCUTANEOUS PEN INJECTOR
SUBCUTANEOUS | 0 refills | 0 days | Status: CP
Start: 2021-05-05 — End: ?

## 2021-05-07 DIAGNOSIS — S83241D Other tear of medial meniscus, current injury, right knee, subsequent encounter: Principal | ICD-10-CM

## 2021-05-25 ENCOUNTER — Telehealth: Admit: 2021-05-25 | Discharge: 2021-05-26 | Payer: PRIVATE HEALTH INSURANCE

## 2021-05-25 DIAGNOSIS — S83241D Other tear of medial meniscus, current injury, right knee, subsequent encounter: Principal | ICD-10-CM

## 2021-05-25 MED ORDER — ONDANSETRON HCL 4 MG TABLET
ORAL_TABLET | Freq: Three times a day (TID) | ORAL | 0 refills | 4 days | Status: CP | PRN
Start: 2021-05-25 — End: ?

## 2021-05-25 MED ORDER — HYDROCODONE 5 MG-ACETAMINOPHEN 325 MG TABLET
ORAL_TABLET | ORAL | 0 refills | 3 days | Status: CP | PRN
Start: 2021-05-25 — End: ?

## 2021-05-25 MED ORDER — GABAPENTIN 100 MG CAPSULE
ORAL_CAPSULE | Freq: Three times a day (TID) | ORAL | 0 refills | 5 days | Status: CP
Start: 2021-05-25 — End: 2021-05-30

## 2021-05-25 MED ORDER — DOCUSATE SODIUM 100 MG CAPSULE
ORAL_CAPSULE | Freq: Two times a day (BID) | ORAL | 0 refills | 10 days | Status: CP
Start: 2021-05-25 — End: 2021-06-04

## 2021-05-25 MED ORDER — ASPIRIN 81 MG TABLET,DELAYED RELEASE
ORAL_TABLET | Freq: Every day | ORAL | 0 refills | 30 days | Status: CP
Start: 2021-05-25 — End: ?

## 2021-05-27 ENCOUNTER — Ambulatory Visit: Admit: 2021-05-27 | Discharge: 2021-05-27 | Payer: PRIVATE HEALTH INSURANCE

## 2021-05-27 ENCOUNTER — Encounter: Admit: 2021-05-27 | Discharge: 2021-05-27 | Payer: PRIVATE HEALTH INSURANCE

## 2021-06-08 ENCOUNTER — Ambulatory Visit: Admit: 2021-06-08 | Discharge: 2021-06-09 | Payer: PRIVATE HEALTH INSURANCE

## 2021-06-10 ENCOUNTER — Ambulatory Visit: Admit: 2021-06-10 | Payer: PRIVATE HEALTH INSURANCE

## 2021-06-29 ENCOUNTER — Ambulatory Visit: Admit: 2021-06-29 | Discharge: 2021-06-30 | Payer: PRIVATE HEALTH INSURANCE

## 2021-07-02 ENCOUNTER — Telehealth
Admit: 2021-07-02 | Discharge: 2021-07-03 | Payer: PRIVATE HEALTH INSURANCE | Attending: Physician Assistant | Primary: Physician Assistant

## 2021-07-09 MED ORDER — ONDANSETRON 4 MG DISINTEGRATING TABLET
ORAL_TABLET | Freq: Three times a day (TID) | ORAL | 0 refills | 5 days | Status: CP | PRN
Start: 2021-07-09 — End: ?

## 2021-07-15 ENCOUNTER — Ambulatory Visit: Admit: 2021-07-15 | Payer: PRIVATE HEALTH INSURANCE

## 2021-07-15 ENCOUNTER — Ambulatory Visit: Admit: 2021-07-15 | Discharge: 2021-08-08 | Payer: PRIVATE HEALTH INSURANCE

## 2021-08-19 ENCOUNTER — Ambulatory Visit: Admit: 2021-08-19 | Payer: PRIVATE HEALTH INSURANCE

## 2021-08-24 MED ORDER — WEGOVY 1 MG/0.5 ML SUBCUTANEOUS PEN INJECTOR
SUBCUTANEOUS | 0 refills | 0 days | Status: CP
Start: 2021-08-24 — End: ?

## 2021-09-07 MED ORDER — WEGOVY 1.7 MG/0.75 ML SUBCUTANEOUS PEN INJECTOR
SUBCUTANEOUS | 2 refills | 28 days | Status: CP
Start: 2021-09-07 — End: ?

## 2021-09-28 ENCOUNTER — Telehealth
Admit: 2021-09-28 | Discharge: 2021-09-29 | Payer: PRIVATE HEALTH INSURANCE | Attending: Physician Assistant | Primary: Physician Assistant

## 2021-09-28 DIAGNOSIS — E785 Hyperlipidemia, unspecified: Principal | ICD-10-CM

## 2021-09-28 DIAGNOSIS — Z79899 Other long term (current) drug therapy: Principal | ICD-10-CM

## 2021-09-28 DIAGNOSIS — R635 Abnormal weight gain: Principal | ICD-10-CM

## 2021-09-28 DIAGNOSIS — Z6827 Body mass index (BMI) 27.0-27.9, adult: Principal | ICD-10-CM

## 2021-09-28 DIAGNOSIS — E663 Overweight: Principal | ICD-10-CM

## 2021-09-28 MED ORDER — METFORMIN 500 MG TABLET
ORAL_TABLET | Freq: Two times a day (BID) | ORAL | 3 refills | 30 days | Status: CP
Start: 2021-09-28 — End: ?

## 2021-10-26 ENCOUNTER — Ambulatory Visit: Admit: 2021-10-26 | Discharge: 2021-10-27 | Payer: PRIVATE HEALTH INSURANCE

## 2021-11-17 MED ORDER — DICLOFENAC SODIUM 75 MG TABLET,DELAYED RELEASE
ORAL_TABLET | Freq: Two times a day (BID) | ORAL | 2 refills | 30 days | Status: CP
Start: 2021-11-17 — End: 2021-12-01

## 2021-12-14 ENCOUNTER — Ambulatory Visit: Admit: 2021-12-14 | Discharge: 2021-12-15 | Payer: PRIVATE HEALTH INSURANCE

## 2021-12-14 MED ORDER — INDOMETHACIN ER 75 MG CAPSULE,EXTENDED RELEASE
ORAL_CAPSULE | Freq: Two times a day (BID) | ORAL | 1 refills | 14 days | Status: CP
Start: 2021-12-14 — End: ?

## 2021-12-28 ENCOUNTER — Telehealth
Admit: 2021-12-28 | Discharge: 2021-12-29 | Payer: PRIVATE HEALTH INSURANCE | Attending: Physician Assistant | Primary: Physician Assistant

## 2021-12-28 DIAGNOSIS — Z6827 Body mass index (BMI) 27.0-27.9, adult: Principal | ICD-10-CM

## 2021-12-28 DIAGNOSIS — Z87898 Personal history of other specified conditions: Principal | ICD-10-CM

## 2021-12-28 DIAGNOSIS — R635 Abnormal weight gain: Principal | ICD-10-CM

## 2021-12-28 DIAGNOSIS — Z79899 Other long term (current) drug therapy: Principal | ICD-10-CM

## 2021-12-28 DIAGNOSIS — E785 Hyperlipidemia, unspecified: Principal | ICD-10-CM

## 2021-12-28 DIAGNOSIS — E663 Overweight: Principal | ICD-10-CM

## 2021-12-28 MED ORDER — ONDANSETRON 4 MG DISINTEGRATING TABLET
ORAL_TABLET | Freq: Three times a day (TID) | ORAL | 3 refills | 10 days | Status: CP | PRN
Start: 2021-12-28 — End: ?

## 2021-12-28 MED ORDER — WEGOVY 1.7 MG/0.75 ML SUBCUTANEOUS PEN INJECTOR
SUBCUTANEOUS | 0 refills | 84 days | Status: CP
Start: 2021-12-28 — End: ?

## 2021-12-28 MED ORDER — METFORMIN 500 MG TABLET
ORAL_TABLET | Freq: Two times a day (BID) | ORAL | 1 refills | 90 days | Status: CP
Start: 2021-12-28 — End: ?

## 2022-01-17 ENCOUNTER — Ambulatory Visit: Admit: 2022-01-17 | Discharge: 2022-01-18 | Payer: BLUE CROSS/BLUE SHIELD

## 2022-01-17 DIAGNOSIS — R070 Pain in throat: Principal | ICD-10-CM

## 2022-01-17 DIAGNOSIS — J069 Acute upper respiratory infection, unspecified: Principal | ICD-10-CM

## 2022-01-21 ENCOUNTER — Ambulatory Visit
Admit: 2022-01-21 | Discharge: 2022-01-22 | Payer: PRIVATE HEALTH INSURANCE | Attending: Student in an Organized Health Care Education/Training Program | Primary: Student in an Organized Health Care Education/Training Program

## 2022-01-21 DIAGNOSIS — H6692 Otitis media, unspecified, left ear: Principal | ICD-10-CM

## 2022-01-21 MED ORDER — AMOXICILLIN 875 MG-POTASSIUM CLAVULANATE 125 MG TABLET
ORAL_TABLET | Freq: Two times a day (BID) | ORAL | 0 refills | 10 days | Status: CP
Start: 2022-01-21 — End: 2022-01-31

## 2022-01-21 MED ORDER — PROMETHAZINE-DM 6.25 MG-15 MG/5 ML ORAL SYRUP
Freq: Four times a day (QID) | ORAL | 0 refills | 6 days | Status: CP | PRN
Start: 2022-01-21 — End: 2022-01-28

## 2022-01-27 DIAGNOSIS — Z1231 Encounter for screening mammogram for malignant neoplasm of breast: Principal | ICD-10-CM

## 2022-02-02 MED ORDER — WEGOVY 0.5 MG/0.5 ML SUBCUTANEOUS PEN INJECTOR
SUBCUTANEOUS | 0 refills | 0 days | Status: CP
Start: 2022-02-02 — End: ?

## 2022-02-22 MED ORDER — INDOMETHACIN ER 75 MG CAPSULE,EXTENDED RELEASE
ORAL_CAPSULE | 1 refills | 0 days | Status: CP
Start: 2022-02-22 — End: ?

## 2022-03-08 MED ORDER — INDOMETHACIN ER 75 MG CAPSULE,EXTENDED RELEASE
ORAL_CAPSULE | Freq: Two times a day (BID) | ORAL | 2 refills | 20 days | Status: CP
Start: 2022-03-08 — End: ?

## 2022-03-26 ENCOUNTER — Ambulatory Visit: Admit: 2022-03-26 | Discharge: 2022-03-26 | Payer: PRIVATE HEALTH INSURANCE

## 2022-04-05 ENCOUNTER — Telehealth
Admit: 2022-04-05 | Discharge: 2022-04-06 | Payer: PRIVATE HEALTH INSURANCE | Attending: Physician Assistant | Primary: Physician Assistant

## 2022-04-05 DIAGNOSIS — Z6828 Body mass index (BMI) 28.0-28.9, adult: Principal | ICD-10-CM

## 2022-04-05 DIAGNOSIS — Z79899 Other long term (current) drug therapy: Principal | ICD-10-CM

## 2022-04-05 DIAGNOSIS — Z8639 Personal history of other endocrine, nutritional and metabolic disease: Principal | ICD-10-CM

## 2022-04-05 DIAGNOSIS — R110 Nausea: Principal | ICD-10-CM

## 2022-04-05 DIAGNOSIS — E663 Overweight: Principal | ICD-10-CM

## 2022-04-05 MED ORDER — ONDANSETRON 4 MG DISINTEGRATING TABLET
ORAL_TABLET | Freq: Three times a day (TID) | ORAL | 3 refills | 10 days | Status: CP | PRN
Start: 2022-04-05 — End: ?

## 2022-04-05 MED ORDER — WEGOVY 2.4 MG/0.75 ML SUBCUTANEOUS PEN INJECTOR
SUBCUTANEOUS | 1 refills | 0 days | Status: CP
Start: 2022-04-05 — End: ?

## 2022-04-05 MED ORDER — WEGOVY 1.7 MG/0.75 ML SUBCUTANEOUS PEN INJECTOR
SUBCUTANEOUS | 0 refills | 84 days | Status: CP
Start: 2022-04-05 — End: ?

## 2022-04-05 MED ORDER — WEGOVY 0.5 MG/0.5 ML SUBCUTANEOUS PEN INJECTOR
SUBCUTANEOUS | 0 refills | 0 days | Status: CP
Start: 2022-04-05 — End: ?

## 2022-04-15 ENCOUNTER — Ambulatory Visit: Admit: 2022-04-15 | Discharge: 2022-04-16 | Payer: PRIVATE HEALTH INSURANCE

## 2022-05-26 DIAGNOSIS — E663 Overweight: Principal | ICD-10-CM

## 2022-05-26 DIAGNOSIS — Z6828 Body mass index (BMI) 28.0-28.9, adult: Principal | ICD-10-CM

## 2022-05-26 MED ORDER — WEGOVY 1 MG/0.5 ML SUBCUTANEOUS PEN INJECTOR
SUBCUTANEOUS | 2 refills | 0 days | Status: CP
Start: 2022-05-26 — End: ?

## 2022-07-21 DIAGNOSIS — M1711 Unilateral primary osteoarthritis, right knee: Principal | ICD-10-CM

## 2022-07-21 MED ORDER — CELECOXIB 200 MG CAPSULE
ORAL_CAPSULE | Freq: Every day | ORAL | 2 refills | 30 days | Status: CP
Start: 2022-07-21 — End: 2023-07-21

## 2022-07-26 ENCOUNTER — Telehealth
Admit: 2022-07-26 | Discharge: 2022-07-27 | Payer: PRIVATE HEALTH INSURANCE | Attending: Physician Assistant | Primary: Physician Assistant

## 2022-07-26 DIAGNOSIS — Z8639 Personal history of other endocrine, nutritional and metabolic disease: Principal | ICD-10-CM

## 2022-07-26 DIAGNOSIS — Z87898 Personal history of other specified conditions: Principal | ICD-10-CM

## 2022-07-26 DIAGNOSIS — Z79899 Other long term (current) drug therapy: Principal | ICD-10-CM

## 2022-07-26 DIAGNOSIS — Z7689 Persons encountering health services in other specified circumstances: Principal | ICD-10-CM

## 2022-07-26 DIAGNOSIS — E669 Obesity, unspecified: Principal | ICD-10-CM

## 2022-07-26 DIAGNOSIS — E785 Hyperlipidemia, unspecified: Principal | ICD-10-CM

## 2022-07-26 MED ORDER — ZEPBOUND 5 MG/0.5 ML SUBCUTANEOUS PEN INJECTOR
SUBCUTANEOUS | 2 refills | 0 days | Status: CP
Start: 2022-07-26 — End: ?

## 2022-07-26 MED ORDER — ZEPBOUND 2.5 MG/0.5 ML SUBCUTANEOUS PEN INJECTOR
SUBCUTANEOUS | 0 refills | 0 days | Status: CP
Start: 2022-07-26 — End: ?
  Filled 2022-07-29: qty 2, 28d supply, fill #0

## 2022-07-29 NOTE — Unmapped (Signed)
[facility] has reached out to this patient via MyChart to onboard them to our Specialty Lite services for their Zepbound filled on 7/11. They will now receive proactive outreach from the pharmacy team for refills.    Mitchel Honour, PharmD Candidate  [facility] Intern

## 2022-07-29 NOTE — Unmapped (Signed)
[facility] Special[facility] Medication Onboarding    Specialty Medication: ZEPBOUND 2.5 mg/0.5 mL injection pen (tirzepatide)  Prior Authorization: Not Required   Financial Assistance: Yes - copay card approved as secondary (e vocuher)  Final Copay/Day Supply: $99 / 28    Insurance Restrictions: None     Notes to Pharmacist: new to therapy  Credit Card on File: yes    The triage team has completed the benefits investigation and has determined that the patient is able to fill this medication at [facility]. Please contact the patient to complete the onboarding or follow up with the prescribing physician as needed.

## 2022-08-10 ENCOUNTER — Ambulatory Visit
Admission: EM | Admit: 2022-08-10 | Discharge: 2022-08-10 | Disposition: A | Discharge status: Home / Self Care | Payer: BC Managed Care – PPO

## 2022-08-10 DIAGNOSIS — Z8619 Personal history of other infectious and parasitic diseases: Secondary | ICD-10-CM

## 2022-08-10 DIAGNOSIS — J0100 Acute maxillary sinusitis, unspecified: Secondary | ICD-10-CM | POA: Diagnosis not present

## 2022-08-10 MED ORDER — AMOXICILLIN 875 MG PO TABS: 875.0000 mg | ORAL_TABLET | Freq: Two times a day (BID) | ORAL | 0 refills | Status: AC

## 2022-08-10 MED ORDER — FLUCONAZOLE 150 MG PO TABS: 150.0000 mg | ORAL_TABLET | Freq: Every day | ORAL | 0 refills | Status: AC

## 2022-08-10 NOTE — ED Triage Notes (Signed)
Patient to Urgent Care with complaints of cough/ nasal congestion/ scratchy throat/ low energy levels. Denies any known fevers.  Reports symptoms started five days ago. Reports same symptoms during the second week of July and thought she was feeling better but symptoms returned.  Used multiple otc medications w/o success.

## 2022-08-10 NOTE — Discharge Instructions (Addendum)
Take the amoxicillin and Diflucan as directed.  Follow up with your primary care provider if your symptoms are not improving.    

## 2022-08-10 NOTE — ED Provider Notes (Signed)
Renaldo Fiddler    CSN: [id number] Arrival date & time: 08/10/22  1656      History   Chief Complaint Chief Complaint  Patient presents with   Cough   Nasal Congestion    HPI Rachel [name] is a 57 y.o. female.  Patient presents with 5 day history of fatigue, sore throat, congestion, cough.  Treating with OTC cold medications without relief.  No fever, rash, shortness of breath, or other symptoms.  Her medical history includes obesity, hyperlipidemia, osteoarthritis.     The history is provided by the patient and medical records.    History reviewed. No pertinent past medical history.  Patient Active Problem List   Diagnosis Date Noted   Chest pain with low risk for cardiac etiology 06/07/2017   Borderline diabetes mellitus 11/29/2016   Vitamin D deficiency 11/29/2016   GAD (generalized anxiety disorder) 11/22/2016   Vaccine counseling 11/22/2016   Heart palpitations 10/23/2015   Female genital symptoms 05/29/2013   Other and unspecified ovarian cyst 05/29/2013    Past Surgical History:  Procedure Laterality Date   ABLATION  2013   BREAST CYST ASPIRATION Right 2015   TUBAL LIGATION  2001    OB History   No obstetric history on file.      Home Medications    Prior to Admission medications   Medication Sig Start Date End Date Taking? Authorizing Provider  amoxicillin (AMOXIL) 875 MG tablet Take 1 tablet (875 mg total) by mouth 2 (two) times daily for 10 days. 08/10/22 08/20/22 Yes Mickie Bail, NP  fluconazole (DIFLUCAN) 150 MG tablet Take 1 tablet (150 mg total) by mouth daily. Take as directed. 08/10/22  Yes Mickie Bail, NP  tirzepatide [facility]) 5 MG/0.5ML Pen Inject into the skin. 07/26/22  Yes [provider]  celecoxib (CELEBREX) 200 MG capsule Take 200 mg by mouth daily.    [provider]  HYDROcodone-acetaminophen (NORCO/VICODIN) 5-325 MG tablet Take 1 tablet by mouth every 4 (four) hours as needed. 03/11/18   Minna Antis,  MD  ibuprofen (ADVIL,MOTRIN) 800 MG tablet Take 1 tablet (800 mg total) by mouth every 8 (eight) hours as needed. 01/24/18   Felecia Shelling, DPM  meloxicam (MOBIC) 15 MG tablet Take 1 tablet (15 mg total) by mouth daily. 05/30/18   Felecia Shelling, DPM  ondansetron (ZOFRAN ODT) 4 MG disintegrating tablet Take 1 tablet (4 mg total) by mouth every 8 (eight) hours as needed for nausea or vomiting. 03/11/18   Minna Antis, MD  oxyCODONE-acetaminophen (PERCOCET) 5-325 MG tablet Take 1 tablet by mouth every 6 (six) hours as needed for severe pain. 01/06/18   Felecia Shelling, DPM  Vitamin D, Ergocalciferol, (DRISDOL) 50000 units CAPS capsule TAKE ONE CAPSULE BY MOUTH ONE TIME PER WEEK 09/27/17   [provider]    Family History Family History  Problem Relation Age of Onset   Breast cancer Neg Hx     Social History Social History   Tobacco Use   Smoking status: Never   Smokeless tobacco: Never  Vaping Use   Vaping status: Never Used  Substance Use Topics   Alcohol use: No   Drug use: No     Allergies   Sulfa antibiotics and Dexlansoprazole   Review of Systems Review of Systems  Constitutional:  Positive for fatigue. Negative for chills and fever.  HENT:  Positive for congestion, postnasal drip, rhinorrhea and sore throat. Negative for ear pain.   Respiratory:  Positive  for cough. Negative for shortness of breath.   Cardiovascular:  Negative for chest pain and palpitations.  Gastrointestinal:  Negative for diarrhea and vomiting.  Skin:  Negative for rash.     Physical Exam Triage Vital Signs ED Triage Vitals  Encounter Vitals Group     BP      Systolic BP Percentile      Diastolic BP Percentile      Pulse      Resp      Temp      Temp src      SpO2      Weight      Height      Head Circumference      Peak Flow      Pain Score      Pain Loc      Pain Education      Exclude from Growth Chart    No data found.  Updated Vital Signs BP 133/82   Pulse (!)  102   Temp 99.6 F (37.6 C)   Resp 18   SpO2 98%   Visual Acuity Right Eye Distance:   Left Eye Distance:   Bilateral Distance:    Right Eye Near:   Left Eye Near:    Bilateral Near:     Physical Exam Vitals and nursing note reviewed.  Constitutional:      General: She is not in acute distress.    Appearance: She is well-developed.  HENT:     Ears:     Comments: TMs mildly erythematous.     Nose: Congestion present.     Mouth/Throat:     Mouth: Mucous membranes are moist.     Pharynx: Posterior oropharyngeal erythema present.  Cardiovascular:     Rate and Rhythm: Normal rate and regular rhythm.     Heart sounds: Normal heart sounds.  Pulmonary:     Effort: Pulmonary effort is normal. No respiratory distress.     Breath sounds: Normal breath sounds.  Musculoskeletal:     Cervical back: Neck supple.  Skin:    General: Skin is warm and dry.  Neurological:     Mental Status: She is alert.  Psychiatric:        Mood and Affect: Mood normal.        Behavior: Behavior normal.      UC Treatments / Results  Labs (all labs ordered are listed, but only abnormal results are displayed) Labs Reviewed - No data to display  EKG   Radiology No results found.  Procedures Procedures (including critical care time)  Medications Ordered in UC Medications - No data to display  Initial Impression / Assessment and Plan / UC Course  I have reviewed the triage vital signs and the nursing notes.  Pertinent labs & imaging results that were available during my care of the patient were reviewed by me and considered in my medical decision making (see chart for details).    Acute sinusitis.  History of vaginal candidiasis when taking antibiotics.  Treating with 10-day course of amoxicillin.  1 tablet of Diflucan also prescribed per patient request.  Tylenol or ibuprofen as needed.  Instructed patient to follow up with her PCP if her symptoms are not improving.  She agrees to plan  of care.    Final Clinical Impressions(s) / UC Diagnoses   Final diagnoses:  Acute non-recurrent maxillary sinusitis  History of candidiasis of vagina     Discharge Instructions  Take the amoxicillin and Diflucan as directed.  Follow up with your primary care provider if your symptoms are not improving.        ED Prescriptions     Medication Sig Dispense Auth. Provider   amoxicillin (AMOXIL) 875 MG tablet Take 1 tablet (875 mg total) by mouth 2 (two) times daily for 10 days. 20 tablet Mickie Bail, NP   fluconazole (DIFLUCAN) 150 MG tablet Take 1 tablet (150 mg total) by mouth daily. Take as directed. 1 tablet Mickie Bail, NP      PDMP not reviewed this encounter.   Mickie Bail, NP 08/10/22 1739

## 2022-08-25 NOTE — Unmapped (Signed)
[facility] Refill Coordination Note    Specialty Lite Medication(s) to be Shipped:   Zepbound    Other medication(s) to be shipped: No additional medications requested for fill at this time     Abigail Bates, DOB: 05/07/1965  Phone: [phone] (home)       All above HIPAA information was verified with patient.     Was a Nurse, learning disability used for this call? No    Changes to medications: [name] reports no changes at this time.  Changes to insurance: No      REFERRAL TO PHARMACIST     Referral to the pharmacist: Not needed      [facility]     Shipping address confirmed in Epic.     Delivery Scheduled: Yes, Expected medication delivery date: 08/31/22 .     Medication will be delivered via UPS to the prescription address in Epic WAM.    Ricci Barker   [facility] Specialty Technician

## 2022-08-30 MED FILL — ZEPBOUND 5 MG/0.5 ML SUBCUTANEOUS PEN INJECTOR: SUBCUTANEOUS | 28 days supply | Qty: 2 | Fill #0

## 2022-09-23 MED FILL — ZEPBOUND 5 MG/0.5 ML SUBCUTANEOUS PEN INJECTOR: SUBCUTANEOUS | 28 days supply | Qty: 2 | Fill #1

## 2022-09-23 NOTE — Unmapped (Signed)
Abigail Bates requested a refill of their Zepbound via IVR/Web. [facility] has scheduled delivery per the patients request via UPS to be delivered to their prescription address on 09/24/22.

## 2022-09-29 DIAGNOSIS — M1711 Unilateral primary osteoarthritis, right knee: Principal | ICD-10-CM

## 2022-09-29 NOTE — Unmapped (Signed)
Request for Hyaluronic Acid injection approval for our patient with Osteoarthritis    -Physical Exam:  Crepitus: yes   Bony Enlargement.yes   Tenderness. yes Morning stiffness.  no      Oral Medications:  Patient has tried multiple oral medications including tylenol and NSAIDS with diminishing response    -Physical Therapy:  Patient has tried a minimum of 3 months of home and structured physical therapy    Xrays:  Xray of the knee demonstrates Joint space narrowing:no  spurring:no  ostephyptes:no  subchondral cysts: no     Prior Steroid Injection:yes    Has the patient had prior HA:  yes   If yes did the patient use less OTC meds yes.  Have improved function yes  Have a decrease in pain yes    There is no plan for TKA in the next 6 months.

## 2022-10-21 NOTE — Unmapped (Signed)
Spoke with representative at [facility], [place].  She confirmed that since Dr. Alveria Apley is a [facility] provider, there is no prior auth required for CPT code [phone] [facility]).  Case reference #[id number].    Synvisc-one  Right knee  [place]    To be administered at [facility].    Sent message to phone room for appointment.

## 2022-10-22 NOTE — Unmapped (Signed)
Abigail Bates requested a refill of their Zepbound via IVR/Web. [facility] has scheduled delivery per the patients request via UPS to be delivered to their prescription address on 10/26/22.

## 2022-10-25 MED FILL — ZEPBOUND 5 MG/0.5 ML SUBCUTANEOUS PEN INJECTOR: SUBCUTANEOUS | 28 days supply | Qty: 2 | Fill #2

## 2022-11-11 ENCOUNTER — Ambulatory Visit: Admit: 2022-11-11 | Discharge: 2022-11-12 | Payer: PRIVATE HEALTH INSURANCE

## 2022-11-11 DIAGNOSIS — M1711 Unilateral primary osteoarthritis, right knee: Principal | ICD-10-CM

## 2022-11-11 MED ADMIN — hylan g-f 20 (SYNVISC-ONE) syringe 48 mg: 48 mg | INTRA_ARTICULAR | @ 19:00:00 | Stop: 2022-11-11

## 2022-11-11 NOTE — Unmapped (Signed)
SPORTS MEDICINE RETURN VISIT    ASSESSMENT AND PLAN      Diagnosis ICD-10-CM Associated Orders   1. Primary localized osteoarthritis of right knee  M17.11 INJECTION     AMB REFERRAL TO PHYSICAL THERAPY     Lg Joint Inj: R knee           Appropriate to proceed with intra-articular viscosupplementation.  Given no effusion today, I recommended against combination viscosupplementation plus corticosteroid, although if she does not have adequate improvement in 8 weeks, I discussed that she should return and at that point we could try corticosteroid injection.  Discussed that I want a limit corticosteroid injection is much as possible as it can lead to more rapid deterioration of the joint.  Furthermore, she does have stiffness with loss of terminal extension and I think that she may benefit from physical therapy.  She was in agreement and would like to try formal PT.  R/B/A discussed and injection was performed without immediate complication.    No follow-ups on file.    Procedure(s):  Consent - Right knee joint viscosupplementation injection  After discussing the various treatment options for the condition, it was agreed that hyaluronic acid injection would be the next step in treatment. The nature of and the indications for injection of hyaluronic acid and local anaesthetic were reviewed in detail with the patient today. The inherent risks of injection, including infection, bleeding, allergic reaction, increased pain, incomplete relief or temporary relief of symptoms, tendon or ligament injury, articular cartilage degeneration, and/or nerve injury were discussed. The patient expressed understanding and consented to proceed.  Procedure   After the risks and benefits of the procedure were explained, verbal consent was given, and a procedural time-out was performed. The knee joint was palpated and the correct anatomical landmarks were used to identify the knee joint. The knee joint and surrounding structures were then visualized with ultrasound. The site for the injection was properly marked and prepped with chlorhexidine solution. The injection site was anesthetized with ethyl chloride and 4 milliliters of 1% lidocaine with a 25 gauge 1.5 inch needle. Using ultrasound guidance, the knee joint was visualized and injected with Synvisc-One using a sterile technique and a 22 gauge 1.5 inch needle. During the injection, there was unrestricted flow and care was taken not to inject into the skin or subcutaneous tissues.  Post-procedure   A sterile adhesive bandage was applied.  Discharge  Instructions were given regarding post-injection care, when to follow up in clinic and what to expect from the procedures. The patient tolerated the procedures well and was discharged without immediate complication.    NEED FOR SONOGRAPHIC GUIDANCE  Given the complexity of this problem and the anatomic location of this structure, sonographic guidance is recommended to prevent injury to neurovascular structures and confirm accuracy of injection. The accuracy of doing these injections blind is poor and the benefit to the patient by using ultrasound guidance is significant to avoid complications.   Reference:  American Medical Society for Sports Medicine (AMSSM) position statement: interventional musculoskeletal ultrasound in sports medicine.  Morey Hummingbird MM, Adams E, Berkoff D, Concoff AL, Jiles Crocker J Sports Med. 2014 Oct 20. pii: [id number]. doi: 10.1136/[id number]      SUBJECTIVE     Chief Complaint:   No chief complaint on file.    History of Present Illness: 57 y.o. female who presents for evaluation and treatment of ongoing right knee pain after undergoing debridement/meniscectomy with Dr. Ivin Poot on  05/27/2021.  I last saw her in March 2024 and performed intra-articular corticosteroid injection plus Synvisc 1.  She states that it provided pretty good pain relief for approximately 2.5 months before the pain started to slowly return.  There has been no new trauma or injury.    Operative report notes:  Patellofemoral compartment: area of Grade III-IV changes in trochlea 10 mm x 20 mm   Medial compartment: complex posterior medial meniscus tear- after resection 30 percent remains.  Area of grade III-IV cartilage damage, 7 mm x 10 mm   Lateral compartment: central area of articular cartilage damage, grade III-IV changes 5 mm x 7 mm    Past Medical History:   Past Medical History:   Diagnosis Date    Anxiety      OBJECTIVE     Physical Exam:  Vitals:   Wt Readings from Last 3 Encounters:   07/26/22 77.1 kg (170 lb)   04/05/22 75.8 kg (167 lb)   01/21/22 72.6 kg (160 lb)     Estimated body mass index is 29.18 kg/m?? as calculated from the following:    Height as of 07/26/22: 162.6 cm (5' 4).    Weight as of 07/26/22: 77.1 kg (170 lb).  Gen: Well-appearing female in no acute distress  MSK: Right knee  Effusion: None  ROM: 10-125  Tenderness: Medial joint line  Valgus: no laxity or pain  Varus: no laxity or pain  Extensor mechanism intact  McMurray: Negative    Imaging/other tests:   X-ray right knee: 03/31/2021  My interpretation: No significant degenerative changes.  Preserved joint spaces    Orthopaedic PROMIS:  Failed to redirect to the Timeline version of the REVFS SmartLink.        10/26/2021 12/14/2021 04/15/2022   [facility]   Lower Extremity Physical Function CAT Score 41 24 32   Pain Interference CAT Score 24 30 31           PRO Status:  Incomplete.       ADMINISTRATIVE     I have personally reviewed and interpreted the images (as available).  Point-of-care ultrasound imaging is on file and stored in a permanent location (if performed).  I have personally reviewed prior records and incorporated relevant information above (as available).    MEDICAL DECISION MAKING (level of service defined by 2/3 elements)     Number/Complexity of Problems Addressed 1 stable chronic illness (99203/99213)   Amount/Complexity of Data to be Reviewed/Analyzed 2 points: Review prior notes (1 point per unique source); Review test results (1 point per unique test); Order tests (1 point per unique test) (99203/99213)   Risk of Complications/Morbidity/Mortality of Management --LOW Risk of Morbidity from Additional Diagnostic Testing or Treatment (99203/99213)--   PT + HA    MODIFIER 25 (Significant, Separately Billable Evaluation and Management)     Documentation to ensure appropriate insurance payment for medically necessary work    Per the [employer] (rev. 01/18/2021) Chapter 13, Section B. Evaluation & Management (E&M) Services, paragraph 5:   ???In general, E&M services on the same date of service as the minor surgical procedure are included in the payment for the procedure???However, a significant and separately identifiable E&M service unrelated to the decision to perform the minor surgical procedure is separately reportable with modifier 25. The E&M service and minor surgical procedure do not require different diagnoses.???    Per the American Medical Association in Reporting CPT Modifier 25 [CPT??Assistant (  Online). 2023;33(11):1-12.] Page 1, Appropriate Use, paragraph 1:   ???Modifier 25 is used to indicate that a patient's condition required a significant, separately identifiable evaluation and management (E/M) service above and beyond that associated with another procedure or service being reported by the same physician or other qualified health care professional [facility]) on the same date. This service must be above and beyond the other service provided or beyond the usual preoperative and postoperative care associated with the procedure or service that was performed on that same date, and it must be substantiated by documentation in the patient's record that satisfies the relevant criteria for the respective E/M service to be reported.???    Per the American Medical Association in Reporting CPT Modifier 25 [CPT??Geophysicist/field seismologist (Online). 2023;33(11):1-12.] Page 2, Considerations, bullet point 2 (Requires awareness of usual preoperative and postoperative services):   ???Pre- and post-operative services typically associated with a procedure include the following and cannot be reported with a separate E/M services code:   Review of patient's relevant past medical history,  Assessment of the problem area to be treated by surgical or other service,  Formulation and explanation of the clinical diagnosis,  Review and explanation of the procedure to the patient, family, or caregiver,  Discussion of alternative treatments or diagnostic options,  Obtaining informed consent,  Providing postoperative care instructions,  Discussion of any further treatment and follow up after the procedure???    As the service provider for this encounter, I attest that the patient's condition required a significant, separately identifiable, medical necessary evaluation and management service in addition to the procedure performed on the same date of service. The evaluation and management service was above and beyond the usual preoperative and postoperative care associated with the procedure. The specific elements of the encounter that represent evaluation and management service above and beyond the usual preoperative and postoperative care associated with the procedure include, but are not limited to:  Reviewed the patient's relevant past surgical history, social history and family history  Reviewed the patient's medications  Reviewed the patient's allergies  Independently obtained history of present illness and relevant review of systems  Independently reviewed laboratory, imaging and/or other data  Independently synthesized history, physical examination, laboratory, imaging and/or other data to formulate a management plan including elements separate from the procedure itself and usual postprocedural care     PROCEDURES     Lg Joint Inj: R knee on 11/11/2022 2:30 PM  Details: ultrasound-guided superolateral approach  Laterality: right  Location: knee  Medications: 48 mg hylan g-f 20 48 mg/6 mL           Karrie Doffing, MS4

## 2022-11-22 ENCOUNTER — Telehealth
Admit: 2022-11-22 | Discharge: 2022-11-23 | Payer: BLUE CROSS/BLUE SHIELD | Attending: Physician Assistant | Primary: Physician Assistant

## 2022-11-22 DIAGNOSIS — Z87898 Personal history of other specified conditions: Principal | ICD-10-CM

## 2022-11-22 DIAGNOSIS — Z8639 Personal history of other endocrine, nutritional and metabolic disease: Principal | ICD-10-CM

## 2022-11-22 DIAGNOSIS — E785 Hyperlipidemia, unspecified: Principal | ICD-10-CM

## 2022-11-22 DIAGNOSIS — E669 Obesity, unspecified: Principal | ICD-10-CM

## 2022-11-22 DIAGNOSIS — Z7689 Persons encountering health services in other specified circumstances: Principal | ICD-10-CM

## 2022-11-22 DIAGNOSIS — Z79899 Other long term (current) drug therapy: Principal | ICD-10-CM

## 2022-11-22 DIAGNOSIS — R110 Nausea: Principal | ICD-10-CM

## 2022-11-22 MED ORDER — ONDANSETRON 4 MG DISINTEGRATING TABLET
ORAL_TABLET | Freq: Three times a day (TID) | ORAL | 3 refills | 10 days | Status: CP | PRN
Start: 2022-11-22 — End: ?

## 2022-11-22 MED ORDER — ZEPBOUND 5 MG/0.5 ML SUBCUTANEOUS PEN INJECTOR
SUBCUTANEOUS | 6 refills | 28 days | Status: CP
Start: 2022-11-22 — End: ?
  Filled 2022-12-13: qty 2, 28d supply, fill #0

## 2022-11-22 NOTE — Unmapped (Signed)
[facility]  Obesity Medicine Note    Telemedicine visit note:   At the beginning of the telemedicine visit, the provider, Haywood Lasso, MMS, PA-C, introduced herself and the patient confirmed name and date-of-birth, Abigail Bates, 03/27/1965. The patient has stated verbally that she is in a safe and private place in the state of [place], and is able to properly see/hear the provider and willing to proceed with the visit. There is no one accompanying her.   Additionally, the capabilities and limitations of telemedicine were briefly discussed. There was a mutual conclusion that a telemedicine visit is appropriate for the patient's circumstances/symptoms. A phone number for immediate contact in the event of a technology failure in order to complete telemedicine encounter was collected.           Abigail Bates is a 57 y.o. female.  -Weight at initial presentation, prior to starting [facility] (10/14/20): 180 lbs and BMI 30.9 kg/m2  -Weight prior to switching to [facility] (04/05/22): 167 lbs  -Weight at previous visit, prior to switching back to Zepbound (07/26/22): 170 lbs and BMI 29.18 kg/m2  -Current vital signs: Height 162.6 cm (5' 4), weight 73.5 kg (162 lb), not currently breastfeeding. Body mass index is 27.81 kg/m??.      Assessment:    Hx of Obesity (BMI 30.0 - 34.9 kg/m2).   Co-morbidities include: Weight gain, anxiety, depression, hx prediabetes, hyperlipidemia.  Patient has lost > 10% of her weight since starting  tirzepatide Greggory Keen first, now Zepbound)  and she is maintaining her weight loss.       Plan:    Hx of Obesity  Medications: Continue Zepbound 5 mg subcutaneous weekly. Refills provided. We discussed the risks and possible side effects of the medication.    Alternative weight loss medications that could be considered in the future include: liraglutide. However, Bernie Covey is not covered on her insurance plan. Patient has failed phentermine, metformin, bupropion, topiramate, and Wegovy previously.     Diet: Patient should continue following a healthy diet consisting of lean protein, complex carbohydrates, fruits and vegetables.    Exercise: Patient should continue the current exercise routine.     Behavioral changes: Patient should continue practicing mindful eating, portion control, self-care, and good sleep hygiene .    Patient is taking the following medications with the potential to cause weight gain: none    Follow up in 6 months.    Hx Prediabetes  Continue Zepbound.   Continue to limit intake of refined carbohydrates.     Hyperlipidemia  Continue to limit intake of saturated fats.   Follow up with PCP for ongoing management of hyperlipidemia.     I personally spent 27 minutes on this patient encounter, which includes all pre, intra, and post visit time on the date of service. This includes time spent face-to-face with the patient, counseling about weight loss, nutrition, physical activity, behavior modification, medications and coordination of care.     Haywood Lasso, PA-C  Obesity Medicine       ____________________________________________________________________________________________________________________________________________    History of Present Illness:    Abigail Bates is a 57 y.o. female who returns for follow-up.    Yanisa is losing weight. She has 8 pounds since her last visit. Total weight loss since starting tirzapatide Greggory Keen first, now Zepbound) is 18 pounds (10% weight loss). Her goal weight is 155 lbs.     Updates:  - She has been getting gel injections in her knees since March 2024.  Had 2nd injection last week. No longer taking Celebrex.     Nutrition:  - Diet has been going well.   - She has been eating foods that are low in fat and sugar. Limiting carb intake. Avoiding fried foods and red meat.   - Eating high protein meals.   - Preparing her meals with her husband because he needs to eat a healthy diet too.     Physical Activity:  - Still walking daily. Also using vibration plate 10 minutes per day.     Behavior changes:  - Sleeping well.  - Mood has been stable.     Anti-obesity meds:  - Taking Zepbound 5 mg weekly. Good appetite suppression. Has some mild nausea on the day of her injection, but this is well controlled with Zofran. She would like to continue the current dose.   - Previously took [place], but it did not work as well as Zepbound. She started off with Mounjaro, switched to [facility], then switched back to Zepbound. She did not lose any weight while taking Wegovy.     Patient currently denies headaches, dizziness, syncope, chest pain, shortness of breath, palpitations, nausea, vomiting , abdominal pain, diarrhea, constipation, and numbness/tingling      Previous use of anti-obesity medications:  Phentermine: yes, lost over 10 pounds in 1 month, caused insomnia  Metformin: yes, currently taking, no SE  Topiramate: yes, SE - tingling, brain fog  Bupropion: yes, took for depression for about 6 months, no side effects, no weight loss  Liraglutide: no  Semaglutide: yes, Wegovy, SE - nausea, no weight loss  Tirzepatide: yes, Mounjaro - lost 13 pounds, Zepbound - currently taking  SGLT-2 inhibitor: no  Orlistat: no  Other agents: Goley    Relevant symptom review:  Glaucoma: no  Palpitations: yes, while taking phentermine  Chest Pain: no  Headaches: no  Nephrolithiasis: no  Seizures: no  H/o pancreatitis: no  Personal or family history of medullary cancer of thyroid: no  Heartburn: yes, takes tums prn      All other systems reviewed were negative  10 organ systems reviewed and pertinent as noted in HPI.       PRIOR AUTHORIZATION QUESTIONS:    Diagnosis code(s): Obesity (E66.9), Hyperlipidemia (E78.5), and Pre-diabetes (R73.03)  Medication requested:  Zepbound 5 mg weekly  Baseline weight and BMI prior to starting requested agent:  Baseline weight: 81.6 kg  Baseline BMI: 30.9 kg/m2  Date: 10/14/20  Is patient currently being treated with the requested agent? YES  Percent weight loss from baseline:  10%  Is patient newly starting therapy? NO  Has patient tried a targeted weight loss agent in the past 12 months? YES  Has the patient been on a weight loss regimen of a low-calorie diet, increased physical activity, and behavioral modifications for a minimum of 6 months prior to initiating therapy with the requested agent? YES  If yes, is the patient currently on and will continue a weight loss regimen of a low-calorie diet, increased physical activity, and behavioral modifications? YES  Will the patient be using the requested agent in combination with another weight loss agent? NO  Did the patient achieve a weight loss of 1 pound or more per week while on a weight loss regimen prior to initiating therapy with the requested agent? NO  Is the patient's age within FDA labeling for the requested indication for the requested agent? YES  Does the patient have any FDA labeled contraindications to the requested agent? NO  Please list all reasons for selecting the requested medication, strength, dosing schedule, and quantity over alternatives: Patient has failed phentermine, metformin, topiramate, bupropion, and Wegovy.   Please list all medications the patient has previously tried and failed for treatment of this diagnosis, including dates of treatment if available: Phentermine, metformin, topiramate, bupropion, ZOXWRU.  Is the patient of [place], [place], or [place] Asian descent? NO  If yes, does the patient have a diagnosis of obesity, confirmed by a BMI of > or = 25 kg/m2? N/A  If no, does the patient have a diagnosis of obesity, confirmed by BMI > or = 30 kg/m2? YES  If no, does the patient have a BMI > or = 27 kg/m2 with at least one weight-related co-morbidity? N/A  Will the patient be using the requested agent in combination with another GLP-1 receptor agonist? NO  Does the patient have a history of pancreatitis? NO           Past Medical History:   Past Medical History:   Diagnosis Date    Anxiety        Past Surgical History:  Past Surgical History:   Procedure Laterality Date    ENDOMETRIAL ABLATION      FOOT SURGERY  2018    HYSTERECTOMY      PR KNEE SCOPE,MED/LAT MENISECTOMY Right 05/27/2021    Procedure: ARTHROSCOPY, KNEE; W/MENISECT(MED/LAT, INCL MENISCAL SHAVE) W/DEBRIDE/SHAVE ARTICULAR CART(CHONDROPLASTY);  Surgeon: Gonzella Lex, MD;  [facility]: [facility];  Service: Orthopedics    PR LAPAROSCOPY TOT HYSTERECTOMY UTERUS >250 GRAM W TUBE/OVARY Bilateral 12/19/2019    Procedure: LAPAROSCOPIC TOTAL HYSTERECTOMY WITH BILATERAL SALPINGECTOMY AND CYSTOSCOPY;  Surgeon: Dolan Amen, DO;  Location: OR [facility];  Service: Obstetrics    TUBAL LIGATION         Allergies: Reviewed  Sulfa (sulfonamide antibiotics) and Dexlansoprazole    Medication list:    Current Outpatient Medications:     celecoxib (CELEBREX) 200 MG capsule, Take 1 capsule (200 mg total) by mouth daily. (Patient not taking: Reported on 11/22/2022), Disp: 30 capsule, Rfl: 2    ondansetron (ZOFRAN-ODT) 4 MG disintegrating tablet, Take 1 tablet (4 mg total) by mouth every eight (8) hours as needed for nausea., Disp: 30 tablet, Rfl: 3    tirzepatide (ZEPBOUND) 2.5 mg/0.5 mL injection pen, Inject 2.5 mg under the skin every seven (7) days. (Patient not taking: Reported on 11/22/2022), Disp: 2 mL, Rfl: 0    tirzepatide (ZEPBOUND) 5 mg/0.5 mL injection pen, Inject 5 mg under the skin every seven (7) days., Disp: 2 mL, Rfl: 2    Above medication list reviewed and updated.    Relevant Family History:  Family History   Problem Relation Age of Onset    Hypertension Mother     Hypertension Father     Stroke Father     No Known Problems Sister     No Known Problems Brother     No Known Problems Maternal Aunt     No Known Problems Maternal Uncle     No Known Problems Paternal Aunt     No Known Problems Paternal Uncle     No Known Problems Maternal Grandmother     No Known Problems Maternal Grandfather     No Known Problems Paternal Grandmother     No Known Problems Paternal Grandfather     No Known Problems Other     Melanoma Neg Hx     Basal cell carcinoma Neg Hx  Squamous cell carcinoma Neg Hx     Anesthesia problems Neg Hx     Broken bones Neg Hx     Cancer Neg Hx     Clotting disorder Neg Hx     Collagen disease Neg Hx     Diabetes Neg Hx     Dislocations Neg Hx     Fibromyalgia Neg Hx     Gout Neg Hx     Hemophilia Neg Hx     Osteoporosis Neg Hx     Rheumatologic disease Neg Hx     Scoliosis Neg Hx     Severe sprains Neg Hx     Sickle cell anemia Neg Hx     Spinal Compression Fracture Neg Hx        Social History  Social History     Socioeconomic History    Marital status: Married   Tobacco Use    Smoking status: Never     Passive exposure: Never    Smokeless tobacco: Never   Vaping Use    Vaping status: Never Used   Substance and Sexual Activity    Alcohol use: Yes   Other Topics Concern    Do you use sunscreen? Yes    Tanning bed use? No    Are you easily burned? No    Excessive sun exposure? No    Blistering sunburns? No   Social History Narrative    ** Merged History Encounter **          Social Determinants of Health     Financial Resource Strain: Low Risk  (07/01/2022)    Received from [facility]    Overall Financial Resource Strain (CARDIA)     Difficulty of Paying Living Expenses: Not hard at all   Food Insecurity: No Food Insecurity (07/01/2022)    Received from [facility]    Hunger Vital Sign     Worried About Running Out of Food in the Last Year: Never true     Ran Out of Food in the Last Year: Never true   Transportation Needs: No Transportation Needs (07/01/2022)    Received from [facility] - Transportation     In the past 12 months, has lack of transportation kept you from medical appointments or from getting medications?: No     Lack of Transportation (Non-Medical): No                 Vital Signs  Height 162.6 cm (5' 4), weight 73.5 kg (162 lb), not currently breastfeeding.  Body mass index is 27.81 kg/m??.    Physical Exam:    Constitutional   General appearance: No acute distress, well appearing.  Neurological  Alert and Oriented x 3.   Psychological   Eye Contact: normal.  Mood: normal.  Affect: normal.  Speech: normal.  Pulmonary   Respiratory effort: Normal.   No acute respiratory distress, no use of accessory muscles of respiration.  Abdomen   Abdomen: Non distended  Skin   No significant rashes noted.   Extremities: No gross edema appreciated. Good ROM of extremities.     Lab results:    The following lab results were personally reviewed in Epic:    CMP, CBC, lipid panel, HbA1c dated 06/24/22. All lab results were within normal limits.           The patient reports they are physically located in [place] and is currently: not  at home. I conducted a audio/video visit. I spent  75m 45s on the video call with the patient. I spent an additional 20 minutes on pre- and post-visit activities on the date of service .

## 2022-11-24 ENCOUNTER — Ambulatory Visit: Payer: BC Managed Care – PPO

## 2022-11-24 DIAGNOSIS — K640 First degree hemorrhoids: Secondary | ICD-10-CM | POA: Diagnosis not present

## 2022-11-24 DIAGNOSIS — Z1211 Encounter for screening for malignant neoplasm of colon: Secondary | ICD-10-CM | POA: Diagnosis present

## 2022-12-06 DIAGNOSIS — E669 Obesity, unspecified: Principal | ICD-10-CM

## 2022-12-06 DIAGNOSIS — Z87898 Personal history of other specified conditions: Principal | ICD-10-CM

## 2022-12-06 DIAGNOSIS — E785 Hyperlipidemia, unspecified: Principal | ICD-10-CM

## 2022-12-11 NOTE — Unmapped (Signed)
Abigail Bates requested a refill of their Zepbound via IVR/Web. [facility] has scheduled delivery per the patients request via UPS to be delivered to their prescription address on 12/14/22.

## 2023-01-04 NOTE — Unmapped (Signed)
Abigail Bates requested a refill of their Zepbound via IVR/Web. [facility] has scheduled delivery per the patients request via UPS to be delivered to their prescription address on 01/06/23.

## 2023-01-05 MED FILL — ZEPBOUND 5 MG/0.5 ML SUBCUTANEOUS PEN INJECTOR: SUBCUTANEOUS | 28 days supply | Qty: 2 | Fill #1

## 2023-01-26 ENCOUNTER — Ambulatory Visit: Admit: 2023-01-26 | Discharge: 2023-01-27 | Payer: BLUE CROSS/BLUE SHIELD

## 2023-01-26 DIAGNOSIS — M1711 Unilateral primary osteoarthritis, right knee: Principal | ICD-10-CM

## 2023-01-26 NOTE — Unmapped (Signed)
SPORTS MEDICINE RETURN VISIT    ASSESSMENT AND PLAN      Diagnosis ICD-10-CM Associated Orders   1. Primary localized osteoarthritis of right knee  M17.11              Appreciable improvement after viscosupplementation.  We discussed alternating viscosupplementation and corticosteroid for more consistent coverage but we discussed the negative would be more rapid progression of underlying arthritis with repeated corticosteroid.  I also discussed that Celebrex is a black box warning and I would recommend against long-term use due to increased risk of cardiovascular events which she is in agreement with.  The time being, we will hold off on additional intervention and she should work on stretching/strengthening independently.  Will plan to repeat viscosupplementation in April at the 27-month mark.  All questions answered.    No follow-ups on file.    Procedure(s):  None    SUBJECTIVE     Chief Complaint:   Chief Complaint   Patient presents with    Right Knee - Injection(s)     Synvisc in October: she notes that she is better since having the gel injection - it took a while to work, but it is overall better than when she received the gel injection the first round. Notes that she is taking celebrex which helps.   Still reports stiffness     History of Present Illness: 58 y.o. female who presents for evaluation and treatment of ongoing right knee pain after undergoing debridement/meniscectomy with Dr. Ivin Poot on 05/27/2021.  I last saw her in March 2024 and performed intra-articular corticosteroid injection plus Synvisc 1.  She states that it provided pretty good pain relief for approximately 2.5 months before the pain started to slowly return.  There has been no new trauma or injury.    Operative report notes:  Patellofemoral compartment: area of Grade III-IV changes in trochlea 10 mm x 20 mm   Medial compartment: complex posterior medial meniscus tear- after resection 30 percent remains.  Area of grade III-IV cartilage damage, 7 mm x 10 mm   Lateral compartment: central area of articular cartilage damage, grade III-IV changes 5 mm x 7 mm    -------------------------------    01/26/2023: She returns today for follow-up of her right knee.  She states that it took approximately 8 weeks for the Synvisc 1 injection to provide benefit.  She feels if it was more effective than the first Synvisc injection I provided earlier in 2024.  She states that she has been taking Celebrex which she also finds beneficial while extra strength Tylenol does not.  Overall, she can tell a big difference after the viscosupplementation.  She still has some persistent stiffness.    Past Medical History:   Past Medical History:   Diagnosis Date    Anxiety      OBJECTIVE     Physical Exam:  Vitals:   Wt Readings from Last 3 Encounters:   11/22/22 73.5 kg (162 lb)   07/26/22 77.1 kg (170 lb)   04/05/22 75.8 kg (167 lb)     Estimated body mass index is 27.81 kg/m?? as calculated from the following:    Height as of 11/22/22: 162.6 cm (5' 4).    Weight as of 11/22/22: 73.5 kg (162 lb).  Gen: Well-appearing female in no acute distress  MSK: Right knee  Effusion: None  ROM: 10-125  Tenderness: None  Valgus: no laxity or pain  Varus: no laxity or pain  McMurray: Negative  Patella  grind: negative    Imaging/other tests:   X-ray right knee: 03/31/2021  My interpretation: No significant degenerative changes.  Preserved joint spaces    Orthopaedic PROMIS:  Failed to redirect to the Timeline version of the REVFS SmartLink.        10/26/2021 12/14/2021 04/15/2022   [facility]   Lower Extremity Physical Function CAT Score 41 24 32   Pain Interference CAT Score 24 30 31        PRO Status:  Incomplete.       ADMINISTRATIVE     I have personally reviewed and interpreted the images (as available).  Point-of-care ultrasound imaging is on file and stored in a permanent location (if performed).  I have personally reviewed prior records and incorporated relevant information above (as available).    MEDICAL DECISION MAKING (level of service defined by 2/3 elements)     Number/Complexity of Problems Addressed 1 stable chronic illness (99203/99213)   Amount/Complexity of Data to be Reviewed/Analyzed 2 points: Review prior notes (1 point per unique source); Review test results (1 point per unique test); Order tests (1 point per unique test) (99203/99213)   Risk of Complications/Morbidity/Mortality of Management --LOW Risk of Morbidity from Additional Diagnostic Testing or Treatment (99203/99213)--     PROCEDURES     Procedures

## 2023-02-04 MED FILL — ZEPBOUND 5 MG/0.5 ML SUBCUTANEOUS PEN INJECTOR: SUBCUTANEOUS | 28 days supply | Qty: 2 | Fill #2

## 2023-02-04 NOTE — Unmapped (Signed)
Abigail Bates requested a refill of their Zepbound via IVR/Web. [facility] has scheduled delivery per the patients request via UPS to be delivered to their prescription address on 02/05/23.

## 2023-02-11 DIAGNOSIS — E669 Obesity, unspecified: Principal | ICD-10-CM

## 2023-02-11 MED ORDER — ZEPBOUND 7.5 MG/0.5 ML SUBCUTANEOUS PEN INJECTOR
SUBCUTANEOUS | 4 refills | 0.00 days | Status: CP
Start: 2023-02-11 — End: ?
  Filled 2023-02-16: qty 2, 28d supply, fill #0

## 2023-02-11 NOTE — Unmapped (Signed)
Patient is tolerating Zepbound 5 mg, but started to notice increased hunger. Requesting a higher dose. Rx sent to [facility] for Zepbound 7.5 mg weekly x 4 weeks, 4 refills.

## 2023-02-15 NOTE — Unmapped (Signed)
[facility] Refill Coordination Note    Specialty Lite Medication(s) to be Shipped:   Zepbound    Other medication(s) to be shipped: No additional medications requested for fill at this time     Abigail Bates, DOB: [birth year]  Phone: There are no phone numbers on file.      All above HIPAA information was verified with patient.     Was a Nurse, learning disability used for this call? No    Changes to medications: [name] reports no changes at this time.  Changes to insurance: No      REFERRAL TO PHARMACIST     Referral to the pharmacist: Not needed      [facility]     Shipping address confirmed in Epic.     Delivery Scheduled: Yes, Expected medication delivery date: 02/17/23.     Medication will be delivered via UPS to the prescription address in Epic WAM.    [name]   [facility] Specialty Technician

## 2023-02-24 DIAGNOSIS — Z1231 Encounter for screening mammogram for malignant neoplasm of breast: Principal | ICD-10-CM

## 2023-03-11 NOTE — Unmapped (Signed)
 Abigail Bates requested a refill of their Zepbound via IVR/Web. The [facility] has scheduled delivery per the patients request via UPS to be delivered to their prescription address on 03/16/23.

## 2023-03-15 ENCOUNTER — Encounter: Admit: 2023-03-15 | Discharge: 2023-03-16 | Payer: BLUE CROSS/BLUE SHIELD

## 2023-03-15 DIAGNOSIS — J069 Acute upper respiratory infection, unspecified: Principal | ICD-10-CM

## 2023-03-15 MED ORDER — BROMPHENIRAMINE-PSEUDOEPHEDRINE-DM 2 MG-30 MG-10 MG/5 ML ORAL SYRUP
Freq: Four times a day (QID) | ORAL | 0 refills | 6.00 days | Status: CP | PRN
Start: 2023-03-15 — End: ?

## 2023-03-15 MED FILL — ZEPBOUND 7.5 MG/0.5 ML SUBCUTANEOUS PEN INJECTOR: SUBCUTANEOUS | 28 days supply | Qty: 2 | Fill #1

## 2023-03-15 NOTE — Unmapped (Signed)
 [employer] [employer] education officer Encounter  This medical encounter was conducted virtually using [email or web address]  [email or web address] protocols.    Patient ID: Abigail Bates is a 58 y.o. female who presents by video interaction for evaluation.    I have identified myself to the patient and conveyed my credentials to [employer].   Patient has signed informed consent on file in medical record.    Present on Video Call: Is there someone else in the room? No.    Assessment/Plan:      Problem List Items Addressed This Visit          Respiratory    Viral URI with cough - Primary    Started 2 days ago with symptoms.  Sneezing, cough, sinus congestion, watery eyes, fatigue, sweats.  No chills.  Home COVID test negative.   [facility] was int he ICU for a week- was in there several days and spent the night also.  Dayquil, flonase.  Advised likely viral. Recommend repeat COVID test tomorrow to be safe.  Rest, fluids. Rx sent in for bromphed for daytime, nyquil at bedtime.   Follow up if symptoms persist or worsen.  Patient verbalizes understanding and has no further questions at this time.          -- Patient verbalized an understanding of today's assessment and recommendations, as well as the purpose of ongoing medications.    Follow-up with PCP    Medication adherence and barriers to the treatment plan have been addressed. Opportunities to optimize healthy behaviors have been discussed. Patient / caregiver voiced understanding.     Subjective:     HPI  Abigail Bates is 58 y.o. and presents today in the [facility] with symptoms.  The PCP for this patient is Marisue Ivan, MD.     Started 2 days ago with symptoms.  Sneezing, cough, sinus congestion, watery eyes, fatigue, sweats.  No chills.  Home COVID test negative.   [facility] was int he ICU for a week- was in there several days and spent the night also.  Dayquil, flonase.    ROS  Review of Systems     All other ROS per HPI.    I have reviewed the problem list, past medical history, past family history, medications, and allergies and have updated/reconciled them if needed.     Objective:   Physical Exam  As part of this Video Visit, no in-person exam was conducted.  Video interaction permitted the following observations.    General: No acute distress.   HEENT:  EOMI.  No photophobia. No conjunctival injection.    RESP: Relaxed respiratory effort. No conversational dyspnea.   SKIN: No rashes noted.  NEURO: No tremors observed.  Normal speech.   PSYCH: Alert and oriented.  Speech fluent and sensible.  Calm affect.       The patient reports they are physically located in [place] and is currently: at home. I conducted a audio/video visit. I spent  64m 22s on the video call with the patient. I spent an additional 3 minutes on pre- and post-visit activities on the date of service .

## 2023-03-15 NOTE — Unmapped (Signed)
 Started 2 days ago with symptoms.  Sneezing, cough, sinus congestion, watery eyes, fatigue, sweats.  No chills.  Home COVID test negative.   [facility] was int he ICU for a week- was in there several days and spent the night also.  Dayquil, flonase.  Advised likely viral. Recommend repeat COVID test tomorrow to be safe.  Rest, fluids. Rx sent in for bromphed for daytime, nyquil at bedtime.   Follow up if symptoms persist or worsen.  Patient verbalizes understanding and has no further questions at this time.

## 2023-03-28 ENCOUNTER — Inpatient Hospital Stay: Admit: 2023-03-28 | Discharge: 2023-03-29 | Payer: BLUE CROSS/BLUE SHIELD

## 2023-04-08 NOTE — Unmapped (Signed)
 Abigail Bates requested a refill of their Zepbound via IVR/Web. The [facility] has scheduled delivery per the patients request via UPS to be delivered to their prescription address on 04/12/23.

## 2023-04-11 MED FILL — ZEPBOUND 7.5 MG/0.5 ML SUBCUTANEOUS PEN INJECTOR: SUBCUTANEOUS | 28 days supply | Qty: 2 | Fill #2

## 2023-04-19 NOTE — Unmapped (Signed)
Request for Hyaluronic Acid injection approval for our patient with Osteoarthritis    -Physical Exam:  Crepitus: yes   Bony Enlargement.yes   Tenderness. yes Morning stiffness.  no      Oral Medications:  Patient has tried multiple oral medications including tylenol and NSAIDS with diminishing response    -Physical Therapy:  Patient has tried a minimum of 3 months of home and structured physical therapy    Xrays:  Xray of the knee demonstrates Joint space narrowing:no  spurring:no  ostephyptes:no  subchondral cysts: no     Prior Steroid Injection:yes    Has the patient had prior HA:  yes   If yes did the patient use less OTC meds yes.  Have improved function yes  Have a decrease in pain yes    There is no plan for TKA in the next 6 months.

## 2023-04-25 NOTE — Unmapped (Signed)
 Called [facility] at [phone].  Spoke with representative, Lane Pinon, who confirmed that CPT code [phone] (Synvisc-one) does not require prior authorization to be administered under buy-and-bill.  Reference number for call: #[id number]    Information obtained to be performed by Dr. Regenia Cape at [facility] clinic, with start date of 05/16/23.    Synvisc-one  Right knee  [place] (at [facility])    Reaching out to patient with appointment options.

## 2023-05-10 MED FILL — ZEPBOUND 7.5 MG/0.5 ML SUBCUTANEOUS PEN INJECTOR: SUBCUTANEOUS | 28 days supply | Qty: 2 | Fill #3

## 2023-05-16 ENCOUNTER — Ambulatory Visit: Admit: 2023-05-16 | Discharge: 2023-05-17 | Payer: BLUE CROSS/BLUE SHIELD

## 2023-05-16 DIAGNOSIS — M1711 Unilateral primary osteoarthritis, right knee: Principal | ICD-10-CM

## 2023-05-16 MED ADMIN — hylan g-f 20 (SYNVISC-ONE) syringe 48 mg: 48 mg | INTRA_ARTICULAR | @ 16:00:00 | Stop: 2023-05-16

## 2023-05-16 NOTE — Unmapped (Signed)
 SPORTS MEDICINE RETURN VISIT    ASSESSMENT AND PLAN      Diagnosis ICD-10-CM Associated Orders   1. Primary localized osteoarthritis of right knee  M17.11 Lg Joint Inj: R knee           Assessment & Plan  Osteoarthritis of right knee  Chronic osteoarthritis with recent pain exacerbation. Previous HA injections effective for 5-6 months. Discussed risks of injection, including rare allergic reactions (pseudosepsis) and infection. Explained slow onset of HA injection.  - Perform right knee injection with Synvisc-1  - Aspirate excess synovial fluid before injection.  - Advise her to report any post-injection swelling.  - Maximum benefit 6-8 weeks.     No follow-ups on file.    Procedure(s):  Consent - Right knee joint viscosupplementation  After discussing the various treatment options for the condition, it was agreed that hyaluronic acid injection would be the next step in treatment. The nature of and the indications for injection of hyaluronic acid and local anaesthetic were reviewed in detail with the patient today. The inherent risks of injection, including infection, bleeding, allergic reaction, increased pain, incomplete relief or temporary relief of symptoms, tendon or ligament injury, articular cartilage degeneration, and/or nerve injury were discussed. The patient expressed understanding and consented to proceed.  Procedure   After the risks and benefits of the procedure were explained, verbal consent was given, and a procedural time-out was performed. The knee joint was palpated and the correct anatomical landmarks were used to identify the knee joint. The knee joint and surrounding structures were then visualized with ultrasound. The site for the injection was properly marked and prepped with chlorhexidine solution. The injection site was anesthetized with ethyl chloride and 4 milliliters of 1% lidocaine with a 25 gauge 1.5 inch needle. Using ultrasound guidance, the knee joint was visualized and injected with Synvisc-One using a sterile technique and a 22 gauge 1.5 inch needle. 3mL of clear non-turbid synovial fluid were aspirated prior to injection. During the injection, there was unrestricted flow and care was taken not to inject into the skin or subcutaneous tissues.  Post-procedure   A sterile adhesive bandage was applied.  Discharge  Instructions were given regarding post-injection care, when to follow up in clinic and what to expect from the procedures. The patient tolerated the procedures well and was discharged without immediate complication.    NEED FOR SONOGRAPHIC GUIDANCE  Given the complexity of this problem and the anatomic location of this structure, sonographic guidance is recommended to prevent injury to neurovascular structures and confirm accuracy of injection. The accuracy of doing these injections blind is poor and the benefit to the patient by using ultrasound guidance is significant to avoid complications.   Reference:  American Medical Society for Sports Medicine (AMSSM) position statement: interventional musculoskeletal ultrasound in sports medicine.  Maretta Shaper MM, Adams E, Berkoff D, Concoff AL, Bland Bunnell J Sports Med. 2014 Oct 20. pii: [id number]. doi: [id number]        SUBJECTIVE     Chief Complaint:   Chief Complaint   Patient presents with    Right Knee - Injection(s)       History of Present Illness  Abigail Bates is a 58 year old female who presents with increased right knee pain for a knee injection.    She has experienced increased right knee pain over the past two weeks, attributed to extensive activity during her daughter's baby shower preparations. The pain has become more noticeable,  prompting her to seek another injection.    The previous HA injection provided relief for nearly six months. She has not experienced swelling until the past week, coinciding with increased activity. No recent injury or popping sounds in the knee have been noted.    She is able to exercise more, which she believes is beneficial for her condition. No pain when the knee is bent or when pressure is applied to certain areas.    Past Medical History:   Past Medical History:   Diagnosis Date    Anxiety      OBJECTIVE     Physical Exam:  Vitals:   Wt Readings from Last 3 Encounters:   05/16/23 73.5 kg (162 lb)   11/22/22 73.5 kg (162 lb)   07/26/22 77.1 kg (170 lb)     Estimated body mass index is 27.81 kg/m?? as calculated from the following:    Height as of this encounter: 162.6 cm (5' 4).    Weight as of this encounter: 73.5 kg (162 lb).  Gen: Well-appearing female in no acute distress  MSK: Right knee  ROM: 0-130  Effusion: Trace  Tenderness: mild anterolateral Hoffa fat pad  No varus/valgus laxity  Patella grind negative    Imaging/other tests: No new imaging      ADMINISTRATIVE     I have personally reviewed and interpreted the images (as available).  Point-of-care ultrasound imaging is on file and stored in a permanent location (if performed).  I have personally reviewed prior records and incorporated relevant information above (as available).    Note written with [employer].      PROCEDURES     Lg Joint Inj: R knee on 05/16/2023 11:40 AM  Details: superolateral approach  Laterality: right  Location: knee  Medications: 48 mg hylan g-f 20 48 mg/6 mL           DME     DME ORDER:  Dx:  ,

## 2023-05-23 ENCOUNTER — Encounter
Admit: 2023-05-23 | Discharge: 2023-05-24 | Payer: BLUE CROSS/BLUE SHIELD | Attending: Physician Assistant | Primary: Physician Assistant

## 2023-05-23 DIAGNOSIS — E669 Obesity, unspecified: Principal | ICD-10-CM

## 2023-05-23 DIAGNOSIS — Z8639 Personal history of other endocrine, nutritional and metabolic disease: Principal | ICD-10-CM

## 2023-05-23 DIAGNOSIS — E785 Hyperlipidemia, unspecified: Principal | ICD-10-CM

## 2023-05-23 DIAGNOSIS — Z87898 Personal history of other specified conditions: Principal | ICD-10-CM

## 2023-05-23 DIAGNOSIS — R110 Nausea: Principal | ICD-10-CM

## 2023-05-23 MED ORDER — ZEPBOUND 7.5 MG/0.5 ML SUBCUTANEOUS PEN INJECTOR
SUBCUTANEOUS | 6 refills | 28.00000 days | Status: CP
Start: 2023-05-23 — End: ?
  Filled 2023-06-07: qty 2, 28d supply, fill #0

## 2023-05-23 MED ORDER — ONDANSETRON 4 MG DISINTEGRATING TABLET
ORAL_TABLET | Freq: Three times a day (TID) | ORAL | 3 refills | 7.00000 days | Status: CP | PRN
Start: 2023-05-23 — End: ?

## 2023-05-23 NOTE — Unmapped (Signed)
  [facility]  Obesity Medicine Note    Telemedicine visit note:   At the beginning of the telemedicine visit, the provider, Aldo Hun, MMS, PA-C, introduced herself and the patient confirmed name and date-of-birth, Abigail Bates, 1965-09-07. The patient has stated verbally that she is in a safe and private place in the state of [place] , and is able to properly see/hear the provider and willing to proceed with the visit. There is no one accompanying her.   Additionally, the capabilities and limitations of telemedicine were briefly discussed. There was a mutual conclusion that a telemedicine visit is appropriate for the patient's circumstances/symptoms. A phone number for immediate contact in the event of a technology failure in order to complete telemedicine encounter was collected.           Abigail Bates is a 58 y.o. female.  -Weight at initial presentation, prior to starting Mounjaro (10/14/20): 180 lbs and BMI 30.9 kg/m2  -Weight prior to switching to [facility] (04/05/22): 167 lbs  -Weight prior to switching back to Zepbound (07/26/22): 170 lbs and BMI 29.18 kg/m2  -Weight at previous visit (11/22/22): 162 lbs  -Current vital signs: Height 162.6 cm (5' 4), weight 70.3 kg (155 lb), not currently breastfeeding. Body mass index is 26.61 kg/m??.      Assessment:    Hx of Obesity (BMI 30.0 - 34.9 kg/m2).   Co-morbidities include: Weight gain, anxiety, depression, hx prediabetes, hyperlipidemia.  Patient has lost > 10% of her weight since starting tirzepatide (Mounjaro first, now Zepbound) and she is maintaining her weight loss.       Plan:    Hx of Obesity  Medications: Continue Zepbound 7.5 mg subcutaneous weekly. Refills provided. We discussed the risks and possible side effects of the medication.    Continue Zofran ODT 4 mg po q8h prn, #20, 3 refills.   Alternative weight loss medications that could be considered in the future include: liraglutide. However, Saxenda is not covered on her insurance plan. Patient has failed phentermine, metformin, bupropion, topiramate, and Wegovy previously.     Diet: Patient should continue following a healthy diet consisting of lean protein, complex carbohydrates, fruits and vegetables.    Exercise: Patient should continue the current exercise routine.     Behavioral changes: Patient should continue practicing mindful eating, portion control, self-care, and good sleep hygiene .    Patient is taking the following medications with the potential to cause weight gain: none    Follow up in 6 months.    Hx Prediabetes  Continue Zepbound.   Continue to limit intake of refined carbohydrates.     Hyperlipidemia  Reviewed lipid panel from June 2024 - Lipids are well controlled. Follow up with PCP for ongoing monitoring, including routine blood work.   Continue to limit intake of saturated fats.     I personally spent 22 minutes on this patient encounter, which includes all pre, intra, and post visit time on the date of service. This includes time spent face-to-face with the patient, counseling about weight loss, nutrition, physical activity, behavior modification, medications and coordination of care.     Aldo Hun, PA-C  Obesity Medicine       ____________________________________________________________________________________________________________________________________________    History of Present Illness:    Abigail Bates is a 58 y.o. female who returns for follow-up.    Kyleena is losing weight. She has lost another 7 pounds since her last visit. Total weight loss since starting tirzapatide (Mounjaro first, now Zepbound)  is 25 pounds (13.9% weight loss). She has reached her goal weight of 155 lbs.     Updates:  - No medical updates.     Nutrition:  - Doing well with her diet.    - Increasing her protein intake. Eating grilled chicken for breakfast fairly often.  - She has been eating foods that are low in fat and sugar. Limiting carb intake. Avoiding fried foods and red meat.   - Doing more meal planning.      Physical Activity:  - Activity has increased.   - Going to the gym 2 days per week. Also walking daily. She enjoys walking outdoors when the weather is nice.      Behavior changes:  - Sleeping well.  - Mood has been stable.     Anti-obesity meds:  - Taking Zepbound 7.5 mg weekly. Good appetite suppression. Still has intermittent nausea, typically on the day that she takes her injection, but this is well controlled with Zofran prn. She would like to continue the current dose.   - Previously took [place], but it did not work as well as Zepbound. She started off with Mounjaro, switched to [facility], then switched back to Zepbound. She did not lose any weight while taking Wegovy.     Patient currently denies headaches, dizziness, syncope, chest pain, shortness of breath, palpitations, nausea, vomiting , abdominal pain, diarrhea, constipation, and numbness/tingling      Previous use of anti-obesity medications:  Phentermine: yes, lost over 10 pounds in 1 month, caused insomnia  Metformin: yes, currently taking, no SE  Topiramate: yes, SE - tingling, brain fog  Bupropion: yes, took for depression for about 6 months, no side effects, no weight loss  Liraglutide: no  Semaglutide: yes, Wegovy, SE - nausea, no weight loss  Tirzepatide: yes, Mounjaro - lost 13 pounds, Zepbound - currently taking  SGLT-2 inhibitor: no  Orlistat: no  Other agents: Goley    Relevant symptom review:  Glaucoma: no  Palpitations: yes, while taking phentermine  Chest Pain: no  Headaches: no  Nephrolithiasis: no  Seizures: no  H/o pancreatitis: no  Personal or family history of medullary cancer of thyroid: no  Heartburn: yes, takes tums prn      All other systems reviewed were negative  10 organ systems reviewed and pertinent as noted in HPI.       PRIOR AUTHORIZATION QUESTIONS:    Diagnosis code(s): Obesity (E66.9), Hyperlipidemia (E78.5), and Pre-diabetes (R73.03)  Medication requested: Zepbound 7.5 mg weekly  Baseline weight and BMI prior to starting requested agent:  Baseline weight: 81.6 kg  Baseline BMI: 30.9 kg/m2  Date: 10/14/20  Is patient currently being treated with the requested agent? YES  Percent weight loss from baseline: 13.9%  Is patient newly starting therapy? NO  Has patient tried a targeted weight loss agent in the past 12 months? YES  Has the patient been on a weight loss regimen of a low-calorie diet, increased physical activity, and behavioral modifications for a minimum of 6 months prior to initiating therapy with the requested agent? YES  If yes, is the patient currently on and will continue a weight loss regimen of a low-calorie diet, increased physical activity, and behavioral modifications? YES  Will the patient be using the requested agent in combination with another weight loss agent? NO  Did the patient achieve a weight loss of 1 pound or more per week while on a weight loss regimen prior to initiating therapy with the requested agent? NO  Is the patient's age within FDA labeling for the requested indication for the requested agent? YES  Does the patient have any FDA labeled contraindications to the requested agent? NO  Please list all reasons for selecting the requested medication, strength, dosing schedule, and quantity over alternatives: Patient has failed phentermine, metformin, topiramate, bupropion, and Wegovy.   Please list all medications the patient has previously tried and failed for treatment of this diagnosis, including dates of treatment if available: Phentermine, metformin, topiramate, bupropion, GNFAOZ.  Is the patient of [place], [place], or [place] Asian descent? NO  If yes, does the patient have a diagnosis of obesity, confirmed by a BMI of > or = 25 kg/m2? N/A  If no, does the patient have a diagnosis of obesity, confirmed by BMI > or = 30 kg/m2? YES  If no, does the patient have a BMI > or = 27 kg/m2 with at least one weight-related co-morbidity? N/A  Will the patient be using the requested agent in combination with another GLP-1 receptor agonist? NO  Does the patient have a history of pancreatitis? NO           Past Medical History:   Past Medical History:   Diagnosis Date    Anxiety        Past Surgical History:  Past Surgical History:   Procedure Laterality Date    ENDOMETRIAL ABLATION      FOOT SURGERY  2018    HYSTERECTOMY      PR KNEE SCOPE,MED/LAT MENISECTOMY Right 05/27/2021    Procedure: ARTHROSCOPY, KNEE; W/MENISECT(MED/LAT, INCL MENISCAL SHAVE) W/DEBRIDE/SHAVE ARTICULAR CART(CHONDROPLASTY);  Surgeon: Monroe Antigua, MD;  [facility];  Service: Orthopedics    PR LAPAROSCOPY TOT HYSTERECTOMY UTERUS >250 GRAM W TUBE/OVARY Bilateral 12/19/2019    Procedure: LAPAROSCOPIC TOTAL HYSTERECTOMY WITH BILATERAL SALPINGECTOMY AND CYSTOSCOPY;  Surgeon: Omelia Bibles, DO;  Location: OR [facility];  Service: Obstetrics    TUBAL LIGATION         Allergies: Reviewed  Sulfa (sulfonamide antibiotics) and Dexlansoprazole    Medication list:    Current Outpatient Medications:     calcium carbonate-vitamin D3 600 mg-10 mcg (400 unit) per tablet, Take 1 tablet by mouth daily., Disp: , Rfl:     ondansetron (ZOFRAN-ODT) 4 MG disintegrating tablet, Take 1 tablet (4 mg total) by mouth every eight (8) hours as needed for nausea., Disp: 30 tablet, Rfl: 3    tirzepatide (ZEPBOUND) 7.5 mg/0.5 mL injection pen, Inject 7.5 mg under the skin every seven (7) days., Disp: 2 mL, Rfl: 4    Above medication list reviewed and updated.    Relevant Family History:  Family History   Problem Relation Age of Onset    Hypertension Mother     Hypertension Father     Stroke Father     No Known Problems Sister     No Known Problems Brother     No Known Problems Maternal Aunt     No Known Problems Maternal Uncle     No Known Problems Paternal Aunt     No Known Problems Paternal Uncle     No Known Problems Maternal Grandmother     No Known Problems Maternal Grandfather     No Known Problems Paternal Grandmother     No Known Problems Paternal Grandfather     No Known Problems Other     Melanoma Neg Hx     Basal cell carcinoma Neg Hx     Squamous cell  carcinoma Neg Hx     Anesthesia problems Neg Hx     Broken bones Neg Hx     Cancer Neg Hx     Clotting disorder Neg Hx     Collagen disease Neg Hx     Diabetes Neg Hx     Dislocations Neg Hx     Fibromyalgia Neg Hx     Gout Neg Hx     Hemophilia Neg Hx     Osteoporosis Neg Hx     Rheumatologic disease Neg Hx     Scoliosis Neg Hx     Severe sprains Neg Hx     Sickle cell anemia Neg Hx     Spinal Compression Fracture Neg Hx        Social History  Social History     Socioeconomic History    Marital status: Married   Tobacco Use    Smoking status: Never     Passive exposure: Never    Smokeless tobacco: Never   Vaping Use    Vaping status: Never Used   Substance and Sexual Activity    Alcohol use: Yes   Other Topics Concern    Do you use sunscreen? Yes    Tanning bed use? No    Are you easily burned? No    Excessive sun exposure? No    Blistering sunburns? No   Social History Narrative    ** Merged History Encounter **          Social Drivers of Health     Financial Resource Strain: Low Risk  (07/01/2022)    Received from [facility]    Overall Financial Resource Strain (CARDIA)     Difficulty of Paying Living Expenses: Not hard at all   Food Insecurity: No Food Insecurity (07/01/2022)    Received from [facility]    Hunger Vital Sign     Worried About Running Out of Food in the Last Year: Never true     Ran Out of Food in the Last Year: Never true   Transportation Needs: No Transportation Needs (07/01/2022)    Received from [facility] - Transportation     In the past 12 months, has lack of transportation kept you from medical appointments or from getting medications?: No     Lack of Transportation (Non-Medical): No   Housing: Low Risk  (07/01/2022) Received [facility]    Housing Stability Vital Sign     Unable to Pay for Housing in the Last Year: No     Number of Times Moved in the Last Year: 0     Homeless in the Last Year: No                 Vital Signs  Height 162.6 cm (5' 4), weight 70.3 kg (155 lb), not currently breastfeeding.  Body mass index is 26.61 kg/m??.    Physical Exam:    Constitutional   General appearance: No acute distress, well appearing.  Neurological  Alert and Oriented x 3.   Psychological   Eye Contact: normal.  Mood: normal.  Affect: normal.  Speech: normal.  Pulmonary   Respiratory effort: Normal.   No acute respiratory distress, no use of accessory muscles of respiration.  Abdomen   Abdomen: Non distended  Skin   No significant rashes noted.   Extremities: No gross edema appreciated. Good ROM of extremities.  Lab results:    The following lab results were personally reviewed in Epic:    CMP, CBC, lipid panel, HbA1c dated 06/24/22. All lab results were within normal limits.           The patient reports they are physically located in [place]  and is currently: not at home. I conducted a audio/video visit. I spent  70m 52s on the video call with the patient. I spent an additional 12 minutes on pre- and post-visit activities on the date of service .

## 2023-07-11 DIAGNOSIS — E669 Obesity, unspecified: Principal | ICD-10-CM

## 2023-07-12 MED FILL — ZEPBOUND 7.5 MG/0.5 ML SUBCUTANEOUS PEN INJECTOR: SUBCUTANEOUS | 28 days supply | Qty: 2 | Fill #1

## 2023-07-27 ENCOUNTER — Encounter: Payer: Self-pay | Admitting: Emergency Medicine

## 2023-07-27 ENCOUNTER — Ambulatory Visit
Admission: EM | Admit: 2023-07-27 | Discharge: 2023-07-27 | Disposition: A | Discharge status: Home / Self Care | Attending: Emergency Medicine | Admitting: Emergency Medicine

## 2023-07-27 DIAGNOSIS — R590 Localized enlarged lymph nodes: Secondary | ICD-10-CM

## 2023-07-27 MED ORDER — AZITHROMYCIN 250 MG PO TABS: 250.0000 mg | ORAL_TABLET | Freq: Every day | ORAL | 0 refills | Status: AC

## 2023-07-27 NOTE — ED Triage Notes (Signed)
 Patient complains of left ear pain x 3 days. Rates pain 4/10. Took Ibuprofen  last night at 8 pm.

## 2023-07-27 NOTE — ED Provider Notes (Signed)
 [name]    CSN: [id number] Arrival date & time: 07/27/23  9047      History   Chief Complaint Chief Complaint  Patient presents with   Otalgia    HPI Rachel [name] is a 58 y.o. female.   Patient presents for evaluation of pain behind the left ear present for 3 days.  Has been constant.  Area feels swollen.  Pain can be felt with chewing.  Has been experiencing mild congestion as well.  Has taken ibuprofen  which has been helpful.  Denies fever, sore throat, cough.  History reviewed. No pertinent past medical history.  Patient Active Problem List   Diagnosis Date Noted   Chest pain with low risk for cardiac etiology 06/07/2017   Borderline diabetes mellitus 11/29/2016   Vitamin D deficiency 11/29/2016   GAD (generalized anxiety disorder) 11/22/2016   Vaccine counseling 11/22/2016   Heart palpitations 10/23/2015   Female genital symptoms 05/29/2013   Other and unspecified ovarian cyst 05/29/2013    Past Surgical History:  Procedure Laterality Date   ABLATION  2013   BREAST CYST ASPIRATION Right 2015   TUBAL LIGATION  2001    OB History   No obstetric history on file.      Home Medications    Prior to Admission medications   Medication Sig Start Date End Date Taking? Authorizing Provider  azithromycin  (ZITHROMAX ) 250 MG tablet Take 1 tablet (250 mg total) by mouth daily. Take first 2 tablets together, then 1 every day until finished. 07/30/23  Yes Keghan Mcfarren R, NP  celecoxib (CELEBREX) 200 MG capsule Take 200 mg by mouth daily.    [provider]  fluconazole  (DIFLUCAN ) 150 MG tablet Take 1 tablet (150 mg total) by mouth daily. Take as directed. 08/10/22   Corlis Burnard DEL, NP  HYDROcodone -acetaminophen  (NORCO/VICODIN) 5-325 MG tablet Take 1 tablet by mouth every 4 (four) hours as needed. 03/11/18   Dorothyann Drivers, MD  ibuprofen  (ADVIL ,MOTRIN ) 800 MG tablet Take 1 tablet (800 mg total) by mouth every 8 (eight) hours as needed. 01/24/18    Janit Thresa HERO, DPM  meloxicam  (MOBIC ) 15 MG tablet Take 1 tablet (15 mg total) by mouth daily. 05/30/18   Janit Thresa HERO, DPM  ondansetron  (ZOFRAN  ODT) 4 MG disintegrating tablet Take 1 tablet (4 mg total) by mouth every 8 (eight) hours as needed for nausea or vomiting. 03/11/18   Dorothyann Drivers, MD  oxyCODONE -acetaminophen  (PERCOCET) 5-325 MG tablet Take 1 tablet by mouth every 6 (six) hours as needed for severe pain. 01/06/18   Janit Thresa HERO, DPM  tirzepatide (ZEPBOUND) 5 MG/0.5ML Pen Inject into the skin. 07/26/22   [provider]  Vitamin D, Ergocalciferol, (DRISDOL) 50000 units CAPS capsule TAKE ONE CAPSULE BY MOUTH ONE TIME PER WEEK 09/27/17   [provider]    Family History Family History  Problem Relation Age of Onset   Breast cancer Neg Hx     Social History Social History   Tobacco Use   Smoking status: Never   Smokeless tobacco: Never  Vaping Use   Vaping status: Never Used  Substance Use Topics   Alcohol use: No   Drug use: No     Allergies   Sulfa antibiotics and Dexlansoprazole   Review of Systems Review of Systems   Physical Exam Triage Vital Signs ED Triage Vitals  Encounter Vitals Group     BP 07/27/23 1005 135/65     Girls Systolic BP Percentile --  Girls Diastolic BP Percentile --      Boys Systolic BP Percentile --      Boys Diastolic BP Percentile --      Pulse Rate 07/27/23 1005 99     Resp 07/27/23 1005 18     Temp 07/27/23 1005 98.7 F (37.1 C)     Temp Source 07/27/23 1005 Oral     SpO2 07/27/23 1005 99 %     Weight --      Height --      Head Circumference --      Peak Flow --      Pain Score 07/27/23 1008 3     Pain Loc --      Pain Education --      Exclude from Growth Chart --    No data found.  Updated Vital Signs BP 135/65 (BP Location: Left Arm)   Pulse 99   Temp 98.7 F (37.1 C) (Oral)   Resp 18   SpO2 99%   Visual Acuity Right Eye Distance:   Left Eye Distance:   Bilateral Distance:     Right Eye Near:   Left Eye Near:    Bilateral Near:     Physical Exam Constitutional:      Appearance: Normal appearance.  HENT:     Right Ear: Tympanic membrane, ear canal and external ear normal.     Left Ear: Tympanic membrane, ear canal and external ear normal.     Nose: Congestion present.     Mouth/Throat:     Pharynx: No oropharyngeal exudate or posterior oropharyngeal erythema.  Eyes:     Extraocular Movements: Extraocular movements intact.  Neck:   Pulmonary:     Effort: Pulmonary effort is normal.  Lymphadenopathy:     Cervical: Cervical adenopathy present.  Neurological:     Mental Status: She is alert and oriented to person, place, and time.      UC Treatments / Results  Labs (all labs ordered are listed, but only abnormal results are displayed) Labs Reviewed - No data to display  EKG   Radiology No results found.  Procedures Procedures (including critical care time)  Medications Ordered in UC Medications - No data to display  Initial Impression / Assessment and Plan / UC Course  I have reviewed the triage vital signs and the nursing notes.  Pertinent labs & imaging results that were available during my care of the patient were reviewed by me and considered in my medical decision making (see chart for details).  Anterior cervical adenopathy  Has lymph node swelling on exam, most likely related to congestion due to a virus, discussed causes and management, recommended supportive care with continued use of NSAIDs, watch and wait antibiotic placed at pharmacy if no improvement seen, may follow-up as needed for any further concerns Final Clinical Impressions(s) / UC Diagnoses   Final diagnoses:  Anterior cervical adenopathy     Discharge Instructions      Lymphadenopathy is when your lymph glands are swollen or larger than normal.   Lymph glands, also called lymph nodes, are clumps of tissue. They filter germs and waste from tissues in your  body to your bloodstream. They're part of your body's defense system, or immune system.  Typically this fixes itself with time and may wax and wane   Continue use of ibuprofen  which helps reduce inflammation and helps with pain  May apply warm compresses over the affected area  May massage the area as tolerated  Take medication for congestion such as antihistamines, pseudoephedrine, Mucinex as this will further help reduce congestion causing the lymph node to swell  If you have not seen any improvement in your symptoms by Saturday you may pick up azithromycin  from the pharmacy for bacterial coverage   ED Prescriptions     Medication Sig Dispense Auth. Provider   azithromycin  (ZITHROMAX ) 250 MG tablet Take 1 tablet (250 mg total) by mouth daily. Take first 2 tablets together, then 1 every day until finished. 6 tablet Loy Little R, NP      PDMP not reviewed this encounter.   Teresa Shelba SAUNDERS, NP 07/27/23 1037

## 2023-07-27 NOTE — Discharge Instructions (Signed)
 Lymphadenopathy is when your lymph glands are swollen or larger than normal.   Lymph glands, also called lymph nodes, are clumps of tissue. They filter germs and waste from tissues in your body to your bloodstream. They're part of your body's defense system, or immune system.  Typically this fixes itself with time and may wax and wane   Continue use of ibuprofen  which helps reduce inflammation and helps with pain  May apply warm compresses over the affected area  May massage the area as tolerated  Take medication for congestion such as antihistamines, pseudoephedrine, Mucinex as this will further help reduce congestion causing the lymph node to swell  If you have not seen any improvement in your symptoms by Saturday you may pick up azithromycin  from the pharmacy for bacterial coverage

## 2023-08-24 MED FILL — ZEPBOUND 7.5 MG/0.5 ML SUBCUTANEOUS PEN INJECTOR: SUBCUTANEOUS | 28 days supply | Qty: 2 | Fill #2

## 2023-09-29 MED FILL — ZEPBOUND 7.5 MG/0.5 ML SUBCUTANEOUS PEN INJECTOR: SUBCUTANEOUS | 28 days supply | Qty: 2 | Fill #3

## 2023-10-31 MED FILL — ZEPBOUND 7.5 MG/0.5 ML SUBCUTANEOUS PEN INJECTOR: SUBCUTANEOUS | 28 days supply | Qty: 2 | Fill #4

## 2023-11-21 DIAGNOSIS — Z87898 Personal history of other specified conditions: Principal | ICD-10-CM

## 2023-11-21 DIAGNOSIS — R110 Nausea: Principal | ICD-10-CM

## 2023-11-21 DIAGNOSIS — E669 Obesity, unspecified: Principal | ICD-10-CM

## 2023-11-21 DIAGNOSIS — E785 Hyperlipidemia, unspecified: Principal | ICD-10-CM

## 2023-11-21 DIAGNOSIS — Z8639 Personal history of other endocrine, nutritional and metabolic disease: Principal | ICD-10-CM

## 2023-11-21 MED ORDER — ZEPBOUND 7.5 MG/0.5 ML SUBCUTANEOUS PEN INJECTOR
SUBCUTANEOUS | 6 refills | 28.00000 days | Status: CP
Start: 2023-11-21 — End: ?
  Filled 2023-11-28: qty 2, 28d supply, fill #0

## 2023-11-21 MED ORDER — ONDANSETRON 4 MG DISINTEGRATING TABLET
ORAL_TABLET | Freq: Three times a day (TID) | ORAL | 3 refills | 7.00000 days | Status: CP | PRN
Start: 2023-11-21 — End: ?

## 2023-11-21 NOTE — Progress Notes (Signed)
 [facility]  Obesity Medicine Note    Telemedicine visit note:   At the beginning of the telemedicine visit, the provider, Geni Mungo, MMS, PA-C, introduced herself and the patient confirmed name and date-of-birth, Abigail Bates, 11/01/1965. The patient has stated verbally that she is in a safe and private place in the state of [place] , and is able to properly see/hear the provider and willing to proceed with the visit. There is no one accompanying her.   Additionally, the capabilities and limitations of telemedicine were briefly discussed. There was a mutual conclusion that a telemedicine visit is appropriate for the patient's circumstances/symptoms. A phone number for immediate contact in the event of a technology failure in order to complete telemedicine encounter was collected.           Abigail Bates is a 59 y.o. female.  -Weight at initial presentation, prior to starting Mounjaro  (10/14/20): 180 lbs and BMI 30.9 kg/m2  -Weight prior to switching to Wegovy  (04/05/22): 167 lbs  -Weight prior to switching back to Zepbound  (07/26/22): 170 lbs and BMI 29.18 kg/m2  -Weight at previous visit (05/23/23): 155 lbs  -Current vital signs: Height 162.6 cm (5' 4), weight 69.9 kg (154 lb), not currently breastfeeding. Body mass index is 26.43 kg/m??.      Assessment:    Hx of Obesity (BMI 30.0 - 34.9 kg/m2).   Co-morbidities include: Weight gain, anxiety, depression, hx prediabetes, hyperlipidemia.  Patient has lost > 10% of her weight since starting tirzepatide  (Mounjaro  first, now Zepbound ) and she is maintaining her weight loss.       Plan:    Hx of Obesity  Medications: Continue Zepbound  7.5 mg subcutaneous weekly. Refills provided. We discussed the risks and possible side effects of the medication.    Continue Zofran  ODT 4 mg po q8h prn, #20, 3 refills.   Add a daily multivitamin to help with hair loss.   Alternative weight loss medications that could be considered in the future include: liraglutide. However, Saxenda is not covered on her insurance plan. Patient has failed phentermine, metformin , bupropion, topiramate, and Wegovy  previously.     Diet: Patient should continue following a healthy diet consisting of lean protein, complex carbohydrates, fruits and vegetables.    Exercise: Patient should increase exercise as tolerated to a goal of at least 150 minutes per week, including cardio and strength training.   Continue walking regularly. Discussed increasing strength training to 2-3 days per week.     Behavioral changes: Patient should continue practicing mindful eating, portion control, self-care, and good sleep hygiene .    Patient is taking the following medications with the potential to cause weight gain: none    Follow up in 6 months.    Hx Prediabetes  Continue Zepbound .   Continue to limit intake of refined carbohydrates.     Hyperlipidemia  Reviewed lipid panel from June 2024 - Lipids are well controlled. Follow up with PCP for ongoing monitoring, including routine blood work.   Continue to limit intake of saturated fats.     I personally spent 22 minutes on this patient encounter, which includes all pre, intra, and post visit time on the date of service. This includes time spent face-to-face with the patient, counseling about weight loss, nutrition, physical activity, behavior modification, medications and coordination of care.     Geni Mungo, PA-C  Obesity Medicine       ____________________________________________________________________________________________________________________________________________    History of Present Illness:  Abigail Bates is a 58 y.o. female who returns for follow-up.    [name] is maintaining a stable weight. Total weight loss since starting tirzapatide (Mounjaro  first, now Zepbound ) is 26 pounds (14.4% weight loss). She has reached her initial goal weight of 155 lbs, but she would like to lose another 5 pounds.     Updates:  - She has noticed some hair thinning recently.      Nutrition:  - Doing well with her diet.    - Increasing her protein intake. Typically eats rotisserie chicken for breakfast.   - She has been eating foods that are low in fat and sugar. Limiting carb intake. Avoiding fried foods and red meat.   - Doing more meal planning.      Physical Activity:  - Working on increasing this. She has been walking daily. She enjoys walking outdoors.     Behavior changes:  - Sleeping well.  - Mood has been stable.     Anti-obesity meds:  - Taking Zepbound  7.5 mg weekly. Good appetite suppression. Still has intermittent nausea, typically on the day that she takes her injection, but this is well controlled with Zofran  prn. She would like to continue the current dose.   - Previously took Wegovy , but it did not work as well as Zepbound . She started off with Mounjaro , switched to Wegovy , then switched back to Zepbound . She did not lose any weight while taking Wegovy .     Patient currently denies headaches, dizziness, syncope, chest pain, shortness of breath, palpitations, nausea, vomiting , abdominal pain, diarrhea, constipation, and numbness/tingling      Previous use of anti-obesity medications:  Phentermine: yes, lost over 10 pounds in 1 month, caused insomnia  Metformin : yes, currently taking, no SE  Topiramate: yes, SE - tingling, brain fog  Bupropion: yes, took for depression for about 6 months, no side effects, no weight loss  Liraglutide: no  Semaglutide : yes, Wegovy , SE - nausea, no weight loss  Tirzepatide : yes, Mounjaro  - lost 13 pounds, Zepbound  - currently taking  SGLT-2 inhibitor: no  Orlistat: no  Other agents: Goley    Relevant symptom review:  Glaucoma: no  Palpitations: yes, while taking phentermine  Chest Pain: no  Headaches: no  Nephrolithiasis: no  Seizures: no  H/o pancreatitis: no  Personal or family history of medullary cancer of thyroid: no  Heartburn: yes, takes tums prn      All other systems reviewed were negative  10 organ systems reviewed and pertinent as noted in HPI.       PRIOR AUTHORIZATION QUESTIONS:    Diagnosis code(s): Obesity (E66.9), Hyperlipidemia (E78.5), and Pre-diabetes (R73.03)  Medication requested: Zepbound  7.5 mg weekly  Baseline weight and BMI prior to starting requested agent:  Baseline weight: 81.6 kg  Baseline BMI: 30.9 kg/m2  Date: 10/14/20  Is patient currently being treated with the requested agent? YES  Percent weight loss from baseline: 14.4%  Is patient newly starting therapy? NO  Has patient tried a targeted weight loss agent in the past 12 months? YES  Has the patient been on a weight loss regimen of a low-calorie diet, increased physical activity, and behavioral modifications for a minimum of 6 months prior to initiating therapy with the requested agent? YES  If yes, is the patient currently on and will continue a weight loss regimen of a low-calorie diet, increased physical activity, and behavioral modifications? YES  Will the patient be using the requested agent in combination with another weight loss  agent? NO  Did the patient achieve a weight loss of 1 pound or more per week while on a weight loss regimen prior to initiating therapy with the requested agent? NO  Is the patient's age within FDA labeling for the requested indication for the requested agent? YES  Does the patient have any FDA labeled contraindications to the requested agent? NO  Please list all reasons for selecting the requested medication, strength, dosing schedule, and quantity over alternatives: Patient has failed phentermine, metformin , topiramate, bupropion, and Wegovy .   Please list all medications the patient has previously tried and failed for treatment of this diagnosis, including dates of treatment if available: Phentermine, metformin , topiramate, bupropion, Wegovy .  Is the patient of South Asian, Southeast Asian, or East Asian descent? NO  If yes, does the patient have a diagnosis of obesity, confirmed by a BMI of > or = 25 kg/m2? N/A  If no, does the patient have a diagnosis of obesity, confirmed by BMI > or = 30 kg/m2? YES  If no, does the patient have a BMI > or = 27 kg/m2 with at least one weight-related co-morbidity? N/A  Will the patient be using the requested agent in combination with another GLP-1 receptor agonist? NO  Does the patient have a history of pancreatitis? NO           Past Medical History:   Past Medical History:   Diagnosis Date    Anxiety        Past Surgical History:  Past Surgical History:   Procedure Laterality Date    ENDOMETRIAL ABLATION      FOOT SURGERY  2018    HYSTERECTOMY      PR KNEE SCOPE,MED/LAT MENISECTOMY Right 05/27/2021    Procedure: ARTHROSCOPY, KNEE; W/MENISECT(MED/LAT, INCL MENISCAL SHAVE) W/DEBRIDE/SHAVE ARTICULAR CART(CHONDROPLASTY);  Surgeon: Reyes Pricilla Raymond, MD;  [facility];  Service: Orthopedics    PR LAPAROSCOPY TOT HYSTERECTOMY UTERUS >250 GRAM W TUBE/OVARY Bilateral 12/19/2019    Procedure: LAPAROSCOPIC TOTAL HYSTERECTOMY WITH BILATERAL SALPINGECTOMY AND CYSTOSCOPY;  Surgeon: Aleck Selby Gin, DO;  Location: OR [facility];  Service: Obstetrics    TUBAL LIGATION         Allergies: Reviewed  Sulfa (sulfonamide antibiotics) and Dexlansoprazole    Medication list:    Current Outpatient Medications:     calcium carbonate-vitamin D3 600 mg-10 mcg (400 unit) per tablet, Take 1 tablet by mouth daily., Disp: , Rfl:     ondansetron  (ZOFRAN -ODT) 4 MG disintegrating tablet, Take 1 tablet (4 mg total) by mouth every eight (8) hours as needed for nausea., Disp: 20 tablet, Rfl: 3    tirzepatide  (ZEPBOUND ) 7.5 mg/0.5 mL injection pen, Inject 7.5 mg under the skin every seven (7) days., Disp: 2 mL, Rfl: 6    Above medication list reviewed and updated.    Relevant Family History:  Family History   Problem Relation Age of Onset    Hypertension Mother     Hypertension Father     Stroke Father     No Known Problems Sister     No Known Problems Brother     No Known Problems Maternal Aunt     No Known Problems Maternal Uncle     No Known Problems Paternal Aunt     No Known Problems Paternal Uncle     No Known Problems Maternal Grandmother     No Known Problems Maternal Grandfather     No Known Problems Paternal Grandmother     No Known  Problems Paternal Grandfather     No Known Problems Other     Melanoma Neg Hx     Basal cell carcinoma Neg Hx     Squamous cell carcinoma Neg Hx     Anesthesia problems Neg Hx     Broken bones Neg Hx     Cancer Neg Hx     Clotting disorder Neg Hx     Collagen disease Neg Hx     Diabetes Neg Hx     Dislocations Neg Hx     Fibromyalgia Neg Hx     Gout Neg Hx     Hemophilia Neg Hx     Osteoporosis Neg Hx     Rheumatologic disease Neg Hx     Scoliosis Neg Hx     Severe sprains Neg Hx     Sickle cell anemia Neg Hx     Spinal Compression Fracture Neg Hx        Social History  Social History     Socioeconomic History    Marital status: Married   Tobacco Use    Smoking status: Never     Passive exposure: Never    Smokeless tobacco: Never   Vaping Use    Vaping status: Never Used   Substance and Sexual Activity    Alcohol use: Yes   Other Topics Concern    Do you use sunscreen? Yes    Tanning bed use? No    Are you easily burned? No    Excessive sun exposure? No    Blistering sunburns? No   Social History Narrative    ** Merged History Encounter **          Social Drivers of Health     Financial Resource Strain: Low Risk  (07/01/2022)    Received [facility]    Overall Financial Resource Strain (CARDIA)     Difficulty of Paying Living Expenses: Not hard at all   Food Insecurity: No Food Insecurity (07/01/2022)    Received from [facility]    Hunger Vital Sign     Worried About Running Out of Food in the Last Year: Never true     Ran Out of Food in the Last Year: Never true   Transportation Needs: No Transportation Needs (07/01/2022)    Received from [facility] - Transportation     In the past 12 months, has lack of transportation kept you from medical appointments or from getting medications?: No     Lack of Transportation (Non-Medical): No   Housing: Low Risk  (07/01/2022)    Received from [facility]    Housing Stability Vital Sign     Unable to Pay for Housing in the Last Year: No     Number of Times Moved in the Last Year: 0     Homeless in the Last Year: No                 Vital Signs  Height 162.6 cm (5' 4), weight 69.9 kg (154 lb), not currently breastfeeding.  Body mass index is 26.43 kg/m??.    Physical Exam:    Constitutional   General appearance: No acute distress, well appearing.  Neurological  Alert and Oriented x 3.   Psychological   Eye Contact: normal.  Mood: normal.  Affect: normal.  Speech: normal.  Pulmonary   Respiratory effort: Normal.   No acute respiratory distress, no  use of accessory muscles of respiration.  Abdomen   Abdomen: Non distended  Skin   No significant rashes noted.   Extremities: No gross edema appreciated. Good ROM of extremities.     Lab results:    The following lab results were personally reviewed in Epic:    CMP, CBC, lipid panel, HbA1c dated 06/24/22. All lab results were within normal limits.           The patient reports they are physically located in [place]  and is currently: not at home. I conducted a audio/video visit. I spent  51m 55s on the video call with the patient. I spent an additional 8 minutes on pre- and post-visit activities on the date of service .

## 2023-11-24 DIAGNOSIS — M1711 Unilateral primary osteoarthritis, right knee: Principal | ICD-10-CM

## 2023-11-24 NOTE — Progress Notes (Signed)
Request for Hyaluronic Acid injection approval for our patient with Osteoarthritis    -Physical Exam:  Crepitus: yes   Bony Enlargement.yes   Tenderness. yes Morning stiffness.  no      Oral Medications:  Patient has tried multiple oral medications including tylenol and NSAIDS with diminishing response    -Physical Therapy:  Patient has tried a minimum of 3 months of home and structured physical therapy    Xrays:  Xray of the knee demonstrates Joint space narrowing:no  spurring:no  ostephyptes:no  subchondral cysts: no     Prior Steroid Injection:yes    Has the patient had prior HA:  yes   If yes did the patient use less OTC meds yes.  Have improved function yes  Have a decrease in pain yes    There is no plan for TKA in the next 6 months.

## 2023-11-24 NOTE — Telephone Encounter (Signed)
 Spoke with patient, explaining that we would not be able to proceed with a gel injection today due to not having prior authorization from insurance. Offered CSI to hold over until gel shara is obtained. Patient declined, choosing to wait for authorization approval for gel.    Abigail Bates

## 2023-12-14 NOTE — Telephone Encounter (Signed)
 I called and spoke to the patient regarding her appointment with Dr. Dawna on 12/19/2023 for HA injection.  I notified her that as of this morning her HA authorization is still pending.  Her insurance company notified us  that we should have a determination by 12/17/2023.  Given the early morning appointment she has scheduled, we have opted to cancel her appointment with Dr. Dawna for Monday.  We will contact her to get her scheduled with Dr. Dawna ASAP as soon as the authorization is obtained.    All of her questions were answered.  She expressed understanding and was pleased with her conversation.    Darlyn Pouch, LAT, ATC, OTC  Department of Orthopaedics

## 2023-12-20 NOTE — Progress Notes (Signed)
 Repeat Synvisc-one  APPROVED  Authorization number: # [id number]  Dates: 12/02/2023 - 01/18/2024    Right knee  [place]  [facility]    Auth has been scanned into media.    Sent message to [facility] for appointment.

## 2023-12-22 DIAGNOSIS — N951 Menopausal and female climacteric states: Principal | ICD-10-CM

## 2023-12-22 MED ORDER — ESTRADIOL 0.025 MG/24 HR SEMIWEEKLY TRANSDERMAL PATCH
MEDICATED_PATCH | TRANSDERMAL | 0 refills | 28.00000 days | Status: CP
Start: 2023-12-22 — End: 2024-01-21
  Filled 2024-01-27: qty 8, 28d supply, fill #0

## 2023-12-22 MED FILL — ZEPBOUND 7.5 MG/0.5 ML SUBCUTANEOUS PEN INJECTOR: SUBCUTANEOUS | 28 days supply | Qty: 2 | Fill #1

## 2023-12-22 NOTE — Patient Instructions (Signed)
 Good afternoon Abigail Bates,     It was a pleasure meeting you today.  As we discussed.  Here is the link that reviews hormone replacement therapy.  If you have any additional questions or concerns please let me know.  Have a great day.    Patient education: Menopausal hormone therapy (Beyond the Basics) - UpToDate     Sincerely,  Dr. Merle

## 2023-12-22 NOTE — Telephone Encounter (Signed)
 Rx was sent to preferred pharmacy, RN called pt to inform. Rosaline, RN

## 2023-12-22 NOTE — Progress Notes (Signed)
 Chief concern/reason for visit: Menopause sx     Assessment/Plan:      Assessment/Plan:       Menopausal syndrome with vasomotor symptoms, insomnia, and vaginal dryness  Persistent menopausal symptoms affecting quality of life. Previous Premarin  not tolerated. Denies history of breast cancer, coronary heart disease, previous venous thromboembolic event or stroke, or active liver disease, unexplained VB, or TIA. S/p hysterectomy.  Discussed risks of HRT use which includes increased risks of CHD events, stroke, VTE, PE, breast cancer. Benefits of use include a reduction of fracture and colorectal cancer risk, and all-cause mortality.  Also briefly reviewed non-hormonal options.  After shared decision making, Abigail Bates has decided to proceed with Estrogen patch.  No progesterone needed as patient is s/p hysterectomy.  Taper dose up monthly until relief of symptoms is achieved. Follow-up in 3 months.  Discussed non-hormonal options    - Advised Replens vaginal moisturizer 2-3 times weekly.  - Recommended lubricant during intercourse.  - Provided HRT information via MyChart.  - Sent one-month patch prescription to [facility].  - Future refills sent to [facility].           Subjective      HPI:  History of Present Illness    Abigail Bates is a 58 year old female who presents with worsening menopausal symptoms.    Menopausal symptoms, including hot flashes, vaginal dryness, and insomnia have worsened over the past few months, significantly impacting her quality of life. She experiences night sweats, requiring the use of a fan and sheets to dry herself due to excessive sweating. Despite dietary measures such as avoiding chocolates, alcohol, and caffeine, her symptoms persist.    Previously prescribed Premarin  tablets were discontinued due to side effects and concerns about potential risks. She is interested in alternative treatments.            Objective      BP 147/85 (BP Site: R Arm, BP Position: Sitting, BP Cuff Size: Medium)  - Pulse 82  - Temp 36.6 ??C (97.9 ??F) (Temporal)  - Ht 162.6 cm (5' 4)  - Wt 72.8 kg (160 lb 9.6 oz)  - BMI 27.57 kg/m??      Physical Exam:     Gen: NAD  Psych: Appropriate mood and affect   Pulm: Normal respiratory effort      I personally spent 20 minutes face-to-face and non-face-to-face in the care of this patient, which includes all pre, intra, and post visit time on the date of service.  All documented time was specific to the E/M visit and does not include any procedures that may have been performed.

## 2023-12-26 DIAGNOSIS — M1711 Unilateral primary osteoarthritis, right knee: Principal | ICD-10-CM

## 2023-12-26 MED ADMIN — hylan g-f 20 (SYNVISC-ONE) syringe 48 mg: 48 mg | INTRA_ARTICULAR | @ 21:00:00 | Stop: 2023-12-26

## 2023-12-26 NOTE — Telephone Encounter (Signed)
 Rn returned pt call all questions answered. Rosaline, RN

## 2023-12-26 NOTE — Progress Notes (Signed)
 SPORTS MEDICINE RETURN VISIT    ASSESSMENT AND PLAN      Diagnosis ICD-10-CM Associated Orders   1. Primary localized osteoarthritis of right knee  M17.11 INJECTION     Lg Joint Inj: R knee           Assessment & Plan  Osteoarthritis of right knee  Last HA injection provided 5 to 6 months of pain relief before the pain started to return in September/October.  Appropriate to proceed with repeat HA injection.  We also spent time discussing genicular nerve block and ablation but given that she is still having good benefit from HA, I would not recommend transitioning to genicular nerve block as I cannot guarantee that it would provide more relief or longer duration of benefit.  She expressed understanding and was agreeable to continue with HA.  R/B/A discussed and the injection was performed as outlined below.    No follow-ups on file.    Procedure(s):  Consent - Right knee joint viscosupplementation  After discussing the various treatment options for the condition, it was agreed that hyaluronic acid injection would be the next step in treatment. The nature of and the indications for injection of hyaluronic acid and local anaesthetic were reviewed in detail with the patient today. The inherent risks of injection, including infection, bleeding, allergic reaction, increased pain, incomplete relief or temporary relief of symptoms, tendon or ligament injury, articular cartilage degeneration, and/or nerve injury were discussed. The patient expressed understanding and consented to proceed.  Procedure   After the risks and benefits of the procedure were explained, verbal consent was given, and a procedural time-out was performed. The knee joint was palpated and the correct anatomical landmarks were used to identify the knee joint. The knee joint and surrounding structures were then visualized with ultrasound. The site for the injection was properly marked and prepped with chlorhexidine solution. The injection site was anesthetized with ethyl chloride and 4 milliliters of 1% lidocaine with a 25 gauge 1.5 inch needle. Using ultrasound guidance, the knee joint was visualized and injected with Synvisc-One using a sterile technique and a 22 gauge 1.5 inch needle. During the injection, there was unrestricted flow and care was taken not to inject into the skin or subcutaneous tissues.  Post-procedure   A sterile adhesive bandage was applied.  Discharge  Instructions were given regarding post-injection care, when to follow up in clinic and what to expect from the procedures. The patient tolerated the procedures well and was discharged without immediate complication.    NEED FOR SONOGRAPHIC GUIDANCE  Given the complexity of this problem and the anatomic location of this structure, sonographic guidance is recommended to prevent injury to neurovascular structures and confirm accuracy of injection. The accuracy of doing these injections blind is poor and the benefit to the patient by using ultrasound guidance is significant to avoid complications.   Reference:  American Medical Society for Sports Medicine (AMSSM) position statement: interventional musculoskeletal ultrasound in sports medicine.  Garnetta ALEC Hurst MM, Adams E, Berkoff D, Concoff AL, Gladstone LELON Claudene DOROTHA Mallissa J Sports Med. 2014 Oct 20. pii: [id number]. doi: [id number]        SUBJECTIVE     Chief Complaint:   Chief Complaint   Patient presents with    Right Knee - Pain, Injection(s)       History of Present Illness  Abigail Bates is a 58 year old female who presents for follow-up of her right knee.  She has known osteoarthritis and  has previously undergone right knee arthroscopy with debridement.  She last saw me at the end of April and received HA injection which she states provided good benefit until September or October when the pain started to gradually return.  She now notes that she is having stiffness, particularly at nighttime as well as when sitting for prolonged periods of time.  No new trauma or injury.    Past Medical History:   Past Medical History:   Diagnosis Date    Anxiety      OBJECTIVE     Physical Exam:  Vitals:   Wt Readings from Last 3 Encounters:   12/26/23 72.6 kg (160 lb)   12/22/23 72.8 kg (160 lb 9.6 oz)   11/21/23 69.9 kg (154 lb)     Estimated body mass index is 27.46 kg/m?? as calculated from the following:    Height as of 12/22/23: 162.6 cm (5' 4).    Weight as of this encounter: 72.6 kg (160 lb).  Gen: Well-appearing female in no acute distress  MSK:     Knee ROM  (active & passive)   Extension (deg)   Flexion (deg)   Right  3 130     Exam of the right Knee:  Ecchymosis: None noted  Soft Tissues No redness, swelling, or localized warmth  Effusion: trace  Tenderness: minimal MJL only  Special tests:   McMurray's: Negative  Knee stability:     Varus: No Laxity   Valgus: No Laxity  Patella:    Crepitus: subtle   Patellar grind: Negative  Strength: Atrophy: Quadriceps tone symmetric   Patient able to perform a straight leg raise: Yes, No evidence of Extensor Lag    Imaging/other tests: No new imaging      ADMINISTRATIVE     I have personally reviewed and interpreted the images (as available).  Point-of-care ultrasound imaging is on file and stored in a permanent location (if performed).  I have personally reviewed prior records and incorporated relevant information above (as available).    Note written with [employer].      MEDICAL DECISION MAKING (level of service defined by 2/3 elements)     Number/Complexity of Problems Addressed 1 stable chronic illness (99203/99213)   Amount/Complexity of Data to be Reviewed/Analyzed --LIMITED (99203/99213)--   Risk of Complications/Morbidity/Mortality of Management Decision for MINOR Surgery (Including Injection) WITHOUT Risk Factors (99203/99213)     MODIFIER 25 (Significant, Separately Billable Evaluation and Management)     Documentation to ensure appropriate insurance payment for medically necessary work    Per the [employer] (rev. 01/18/2021) Chapter 13, Section B. Evaluation & Management (E&M) Services, paragraph 5:   ???In general, E&M services on the same date of service as the minor surgical procedure are included in the payment for the procedure???However, a significant and separately identifiable E&M service unrelated to the decision to perform the minor surgical procedure is separately reportable with modifier 25. The E&M service and minor surgical procedure do not require different diagnoses.???    Per the American Medical Association in Reporting CPT Modifier 25 [CPT??Geophysicist/field Seismologist (Online). 2023;33(11):1-12.] Page 1, Appropriate Use, paragraph 1:   ???Modifier 25 is used to indicate that a patient's condition required a significant, separately identifiable evaluation and management (E/M) service above and beyond that associated with another procedure or service being reported by the same physician or other qualified health care professional [facility]) on the same date. This service must be above  and beyond the other service provided or beyond the usual preoperative and postoperative care associated with the procedure or service that was performed on that same date, and it must be substantiated by documentation in the patient's record that satisfies the relevant criteria for the respective E/M service to be reported.???    Per the American Medical Association in Reporting CPT Modifier 25 [CPT??Geophysicist/field Seismologist (Online). 2023;33(11):1-12.] Page 2, Considerations, bullet point 2 (Requires awareness of usual preoperative and postoperative services):   ???Pre- and post-operative services typically associated with a procedure include the following and cannot be reported with a separate E/M services code:   Review of patient's relevant past medical history,  Assessment of the problem area to be treated by surgical or other service,  Formulation and explanation of the clinical diagnosis,  Review and explanation of the procedure to the patient, family, or caregiver,  Discussion of alternative treatments or diagnostic options,  Obtaining informed consent,  Providing postoperative care instructions,  Discussion of any further treatment and follow up after the procedure???    As the service provider for this encounter, I attest that the patient's condition required a significant, separately identifiable, medical necessary evaluation and management service in addition to the procedure performed on the same date of service. The evaluation and management service was above and beyond the usual preoperative and postoperative care associated with the procedure. The specific elements of the encounter that represent evaluation and management service above and beyond the usual preoperative and postoperative care associated with the procedure include, but are not limited to:  Reviewed the patient's relevant past surgical history, social history and family history  Reviewed the patient's medications  Reviewed the patient's allergies  Independently obtained history of present illness and relevant review of systems  Independently reviewed laboratory, imaging and/or other data  Independently synthesized history, physical examination, laboratory, imaging and/or other data to formulate a management plan including elements separate from the procedure itself and usual postprocedural care     DISCUSSED OTHER TREATMENT OPTIONS    PROCEDURES     Lg Joint Inj: R knee on 12/26/2023 3:40 PM  Details: superolateral approach  Laterality: right  Location: knee  Medications: 48 mg hylan g-f 20 48 mg/6 mL

## 2024-01-16 DIAGNOSIS — R6889 Other general symptoms and signs: Principal | ICD-10-CM

## 2024-01-16 NOTE — Progress Notes (Signed)
 The patient reports they are physically located in [place]  and is currently: not at home. I conducted a audio/video visit. I spent  41m 31s on the video call with the patient. I spent an additional 11 minutes on pre- and post-visit activities on the date of service .       Patient ID: Abigail Bates, date of birth 1965-03-18 is a 58 y.o. female.    Chief Complaint: No chief complaint on file.     Assessment/Plan:  Assessment & Plan        Assessment & Plan  Flu-like symptoms         Informed patient high rates of flu in the community at present.  Offered to send patient for flu testing however all available [place] clinics are far away.  After discussion, patient agreed to go to a pharmacy and purchase an at-home covid/flu test.  She will message [facility] virtual clinic back if she tests positive.  In the interim, recommend rest, plenty of fluids, tylenol /motrin , quarantine.  Recommend in-clinic evaluation if symptoms worsen.  Verbalized understanding, agreed with plan.      No follow-ups on file.    HPI: HPI    History of Present Illness    Patient c/o upper respiratory symptoms x 2 days. Experiencing nasal congestion, fatigue, chills. Symptoms gradually worsened today and patient now c/o body aches as well. Patient c/o mild cough but no shortness of breath or wheezing. Denies any known flu or covid exposure. Patient is vaccinated for flu.    Patient lives approximately 45 minutes from the closest [facility]. She attempted to go to 2 other local urgent cares for evaluation however they all had a 3 hour wait time.    Objective:  There were no vitals filed for this visit.  There is no height or weight on file to calculate BMI.    Results:   Results      Physical Exam:   Physical Exam        Physical Exam  Constitutional:       General: She is not in acute distress.     Appearance: Normal appearance. She is not ill-appearing, toxic-appearing or diaphoretic.   HENT:      Head: Normocephalic.   Eyes: General: Lids are normal.      Conjunctiva/sclera: Conjunctivae normal.   Musculoskeletal:      Cervical back: Normal range of motion.   Neurological:      General: No focal deficit present.      Mental Status: She is alert and oriented to person, place, and time.   Psychiatric:         Mood and Affect: Mood normal.         Behavior: Behavior normal. Behavior is cooperative.         Thought Content: Thought content normal.         Judgment: Judgment normal.                     Manuelita KATHEE Nice, FNP

## 2024-01-16 NOTE — Progress Notes (Deleted)
{      Coding tips - Do not edit this text, it will delete upon signing of note!    Telephone visits [phone] for Physicians and APPs and [phone] for Non- Physician Clinicians)- Only use minutes on the phone to determine level of service.    Video visits [phone]) - Use either level of medical decision making just as an in-person visit OR time which includes both minutes on video and pre/post minutes to determine the level of service.      :[id number]}  The patient reports they are physically located in [place]  and is currently: not at home. I conducted a audio/video visit. I spent  53m 31s on the video call with the patient. I spent an additional 11 minutes on pre- and post-visit activities on the date of service .       Patient ID: Abigail Bates, date of birth 1965/06/11 is a 58 y.o. female.    Chief Complaint: No chief complaint on file.     Assessment/Plan:  Assessment & Plan        Assessment & Plan        No follow-ups on file.    HPI: HPI    History of Present Illness        Objective:  There were no vitals filed for this visit.  There is no height or weight on file to calculate BMI.    Results:   Results      Physical Exam:   Physical Exam        Physical Exam    {Labs/Imaging/Pathology (Optional):[id number]}    {MDM Documentation Elements (Optional) :[id number]}    {Time Attest / VideoTeleDisclaimer / Interpreter (Optional):[id number]}    Manuelita KATHEE Nice, FNP

## 2024-01-18 DIAGNOSIS — E669 Obesity, unspecified: Principal | ICD-10-CM

## 2024-01-31 MED FILL — ZEPBOUND 7.5 MG/0.5 ML SUBCUTANEOUS PEN INJECTOR: SUBCUTANEOUS | 28 days supply | Qty: 2 | Fill #2

## 2024-02-23 MED ORDER — ESTRADIOL 0.05 MG/24 HR SEMIWEEKLY TRANSDERMAL PATCH
MEDICATED_PATCH | TRANSDERMAL | 1 refills | 28.00000 days | Status: CP
Start: 2024-02-23 — End: 2024-04-23
